# Patient Record
Sex: Female | Born: 1970 | Race: White | Hispanic: No | Marital: Married | Smoking: Current every day smoker
Health system: Southern US, Community
[De-identification: ages and names within clinical notes are randomized; demographics above are authoritative.]

## PROBLEM LIST (undated history)

## (undated) DIAGNOSIS — R519 Headache, unspecified: Secondary | ICD-10-CM

## (undated) DIAGNOSIS — R51 Headache: Secondary | ICD-10-CM

## (undated) DIAGNOSIS — C801 Malignant (primary) neoplasm, unspecified: Secondary | ICD-10-CM

## (undated) DIAGNOSIS — T7840XA Allergy, unspecified, initial encounter: Secondary | ICD-10-CM

## (undated) HISTORY — DX: Headache, unspecified: R51.9

## (undated) HISTORY — DX: Headache: R51

## (undated) HISTORY — DX: Malignant (primary) neoplasm, unspecified: C80.1

## (undated) HISTORY — DX: Allergy, unspecified, initial encounter: T78.40XA

## (undated) HISTORY — PX: TUBAL LIGATION: SHX77

---
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
## 1995-02-15 HISTORY — PX: TONSILECTOMY/ADENOIDECTOMY WITH MYRINGOTOMY: SHX6125

## 2000-03-23 ENCOUNTER — Encounter: Payer: Self-pay | Admitting: Family Medicine

## 2000-03-23 ENCOUNTER — Encounter: Admission: RE | Admit: 2000-03-23 | Discharge: 2000-03-23 | Payer: Self-pay | Admitting: Family Medicine

## 2000-03-27 ENCOUNTER — Encounter: Payer: Self-pay | Admitting: Family Medicine

## 2000-03-27 ENCOUNTER — Encounter: Admission: RE | Admit: 2000-03-27 | Discharge: 2000-03-27 | Payer: Self-pay | Admitting: Family Medicine

## 2001-07-23 ENCOUNTER — Encounter: Payer: Self-pay | Admitting: Family Medicine

## 2001-07-23 ENCOUNTER — Encounter: Admission: RE | Admit: 2001-07-23 | Discharge: 2001-07-23 | Payer: Self-pay | Admitting: Family Medicine

## 2001-08-28 ENCOUNTER — Inpatient Hospital Stay (HOSPITAL_COMMUNITY): Admission: EM | Admit: 2001-08-28 | Discharge: 2001-09-04 | Payer: Self-pay | Admitting: Psychiatry

## 2001-08-28 ENCOUNTER — Encounter: Payer: Self-pay | Admitting: Emergency Medicine

## 2001-08-28 ENCOUNTER — Emergency Department (HOSPITAL_COMMUNITY): Admission: EM | Admit: 2001-08-28 | Discharge: 2001-08-28 | Payer: Self-pay | Admitting: Emergency Medicine

## 2001-09-28 ENCOUNTER — Inpatient Hospital Stay (HOSPITAL_COMMUNITY): Admission: EM | Admit: 2001-09-28 | Discharge: 2001-09-30 | Payer: Self-pay | Admitting: Psychiatry

## 2002-10-24 ENCOUNTER — Emergency Department (HOSPITAL_COMMUNITY): Admission: EM | Admit: 2002-10-24 | Discharge: 2002-10-24 | Payer: Self-pay | Admitting: Emergency Medicine

## 2002-10-24 ENCOUNTER — Encounter: Payer: Self-pay | Admitting: Emergency Medicine

## 2003-01-05 ENCOUNTER — Emergency Department (HOSPITAL_COMMUNITY): Admission: EM | Admit: 2003-01-05 | Discharge: 2003-01-05 | Payer: Self-pay | Admitting: Emergency Medicine

## 2003-04-10 ENCOUNTER — Emergency Department (HOSPITAL_COMMUNITY): Admission: EM | Admit: 2003-04-10 | Discharge: 2003-04-11 | Payer: Self-pay | Admitting: Emergency Medicine

## 2004-02-11 ENCOUNTER — Ambulatory Visit (HOSPITAL_COMMUNITY): Admission: RE | Admit: 2004-02-11 | Discharge: 2004-02-11 | Payer: Self-pay | Admitting: Family Medicine

## 2004-02-28 ENCOUNTER — Emergency Department (HOSPITAL_COMMUNITY): Admission: EM | Admit: 2004-02-28 | Discharge: 2004-02-28 | Payer: Self-pay | Admitting: Emergency Medicine

## 2004-03-08 ENCOUNTER — Ambulatory Visit (HOSPITAL_COMMUNITY): Admission: RE | Admit: 2004-03-08 | Discharge: 2004-03-08 | Payer: Self-pay | Admitting: Family Medicine

## 2004-03-11 ENCOUNTER — Ambulatory Visit (HOSPITAL_COMMUNITY): Admission: RE | Admit: 2004-03-11 | Discharge: 2004-03-11 | Payer: Self-pay | Admitting: Family Medicine

## 2004-05-09 ENCOUNTER — Emergency Department (HOSPITAL_COMMUNITY): Admission: EM | Admit: 2004-05-09 | Discharge: 2004-05-09 | Payer: Self-pay | Admitting: Emergency Medicine

## 2004-09-21 ENCOUNTER — Encounter (INDEPENDENT_AMBULATORY_CARE_PROVIDER_SITE_OTHER): Payer: Self-pay | Admitting: *Deleted

## 2004-09-21 ENCOUNTER — Ambulatory Visit (HOSPITAL_COMMUNITY): Admission: RE | Admit: 2004-09-21 | Discharge: 2004-09-21 | Payer: Self-pay | Admitting: Gastroenterology

## 2005-05-27 IMAGING — CT CT ABDOMEN W/ CM
1 of 3 series · 13 of 32 positions shown, 19 images · non-contrast
Comparison: none

HISTORY: Abdominal and pelvic pain

[Series 7487: — · axial · 0.62mm/px · z∈[+1190,+1550]mm · 13 of 86 slices shown, 19 images]
[im 7/86  soft-tissue]
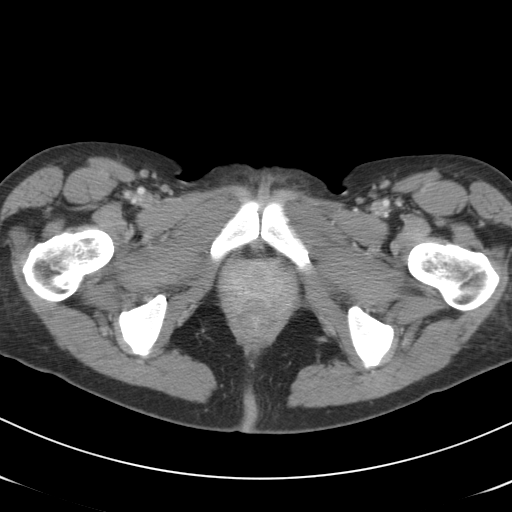
[im 7/86  bone]
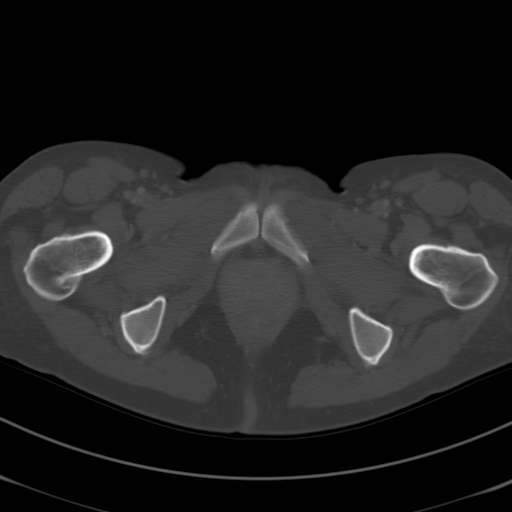
[im 13/86  soft-tissue]
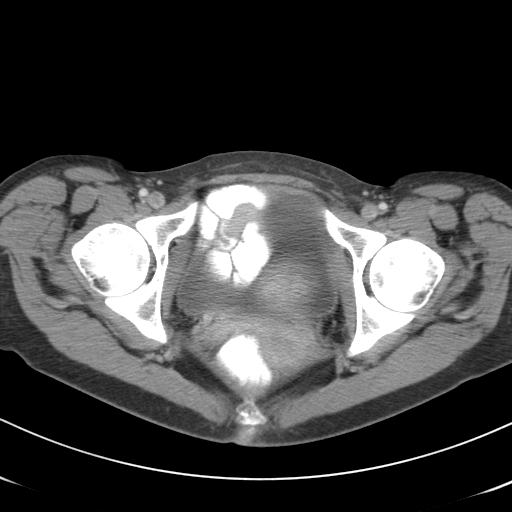
[im 19/86  soft-tissue]
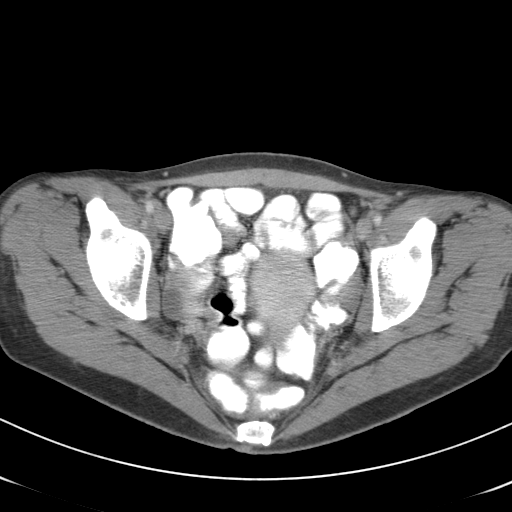
[im 25/86  soft-tissue]
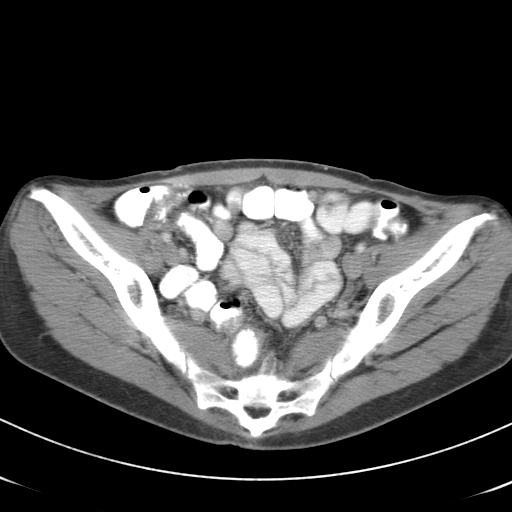
[im 31/86  soft-tissue]
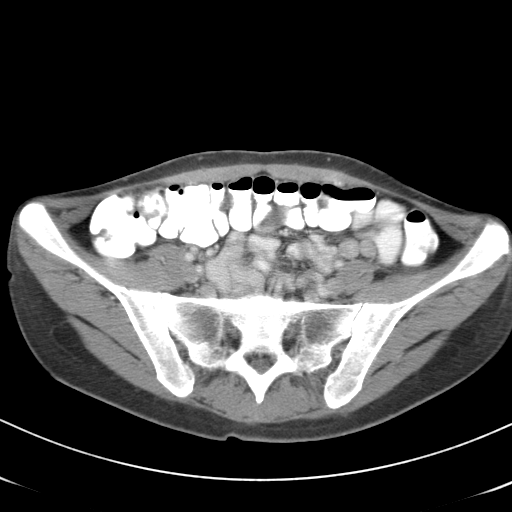
[im 37/86  soft-tissue]
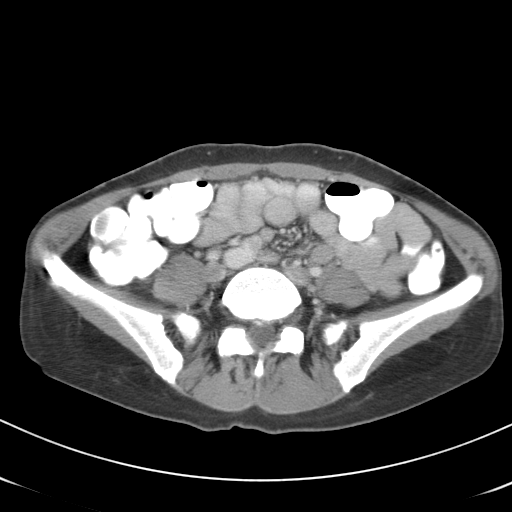
[im 43/86  soft-tissue]
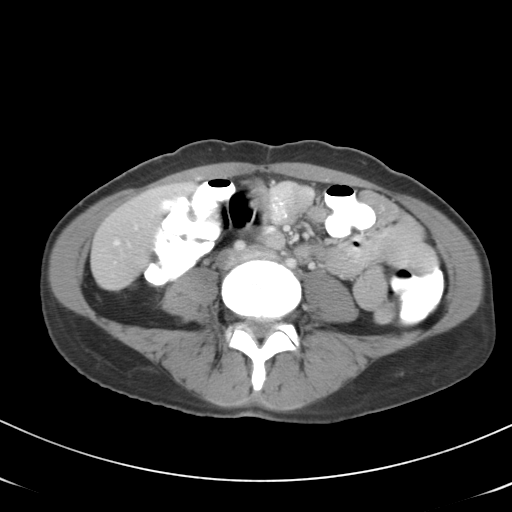
[im 49/86  soft-tissue]
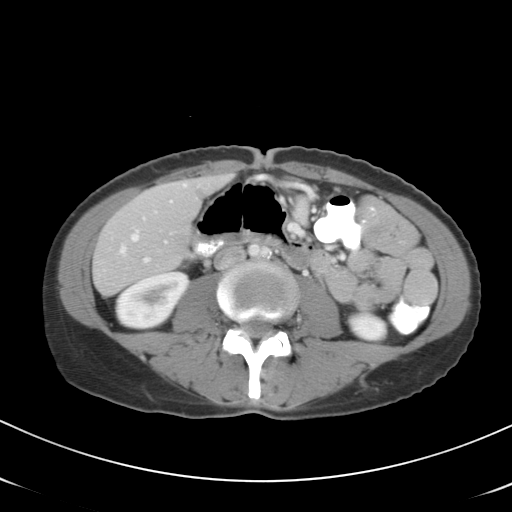
[im 55/86  soft-tissue]
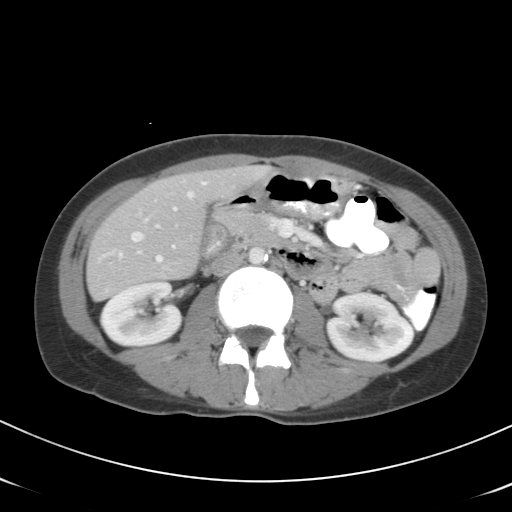
[im 55/86  bone]
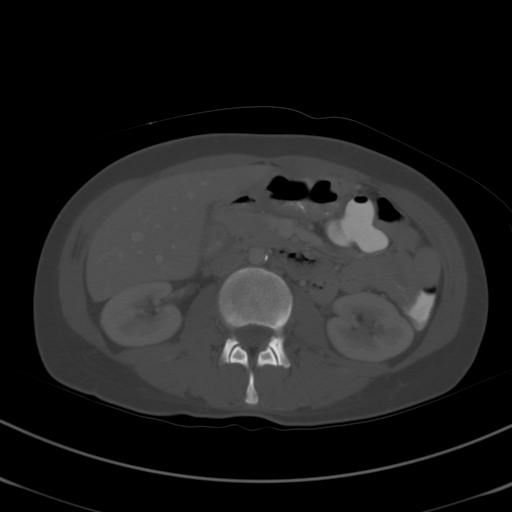
[im 61/86  soft-tissue]
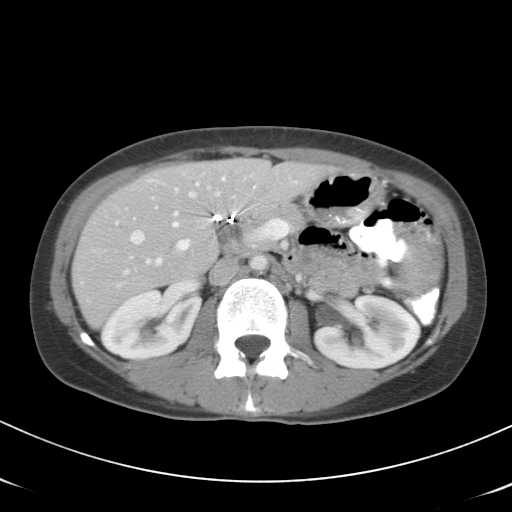
[im 61/86  lung]
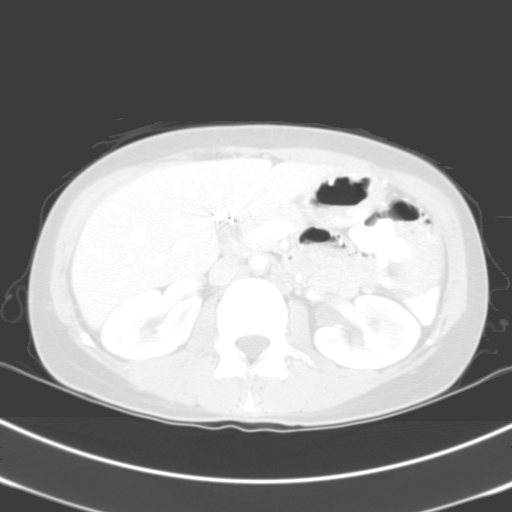
[im 67/86  soft-tissue]
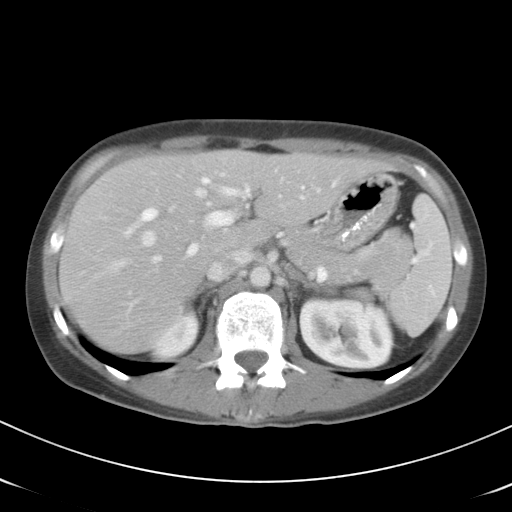
[im 67/86  lung]
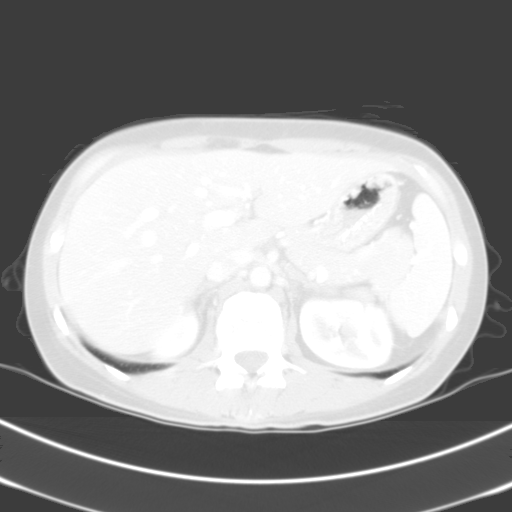
[im 73/86  soft-tissue]
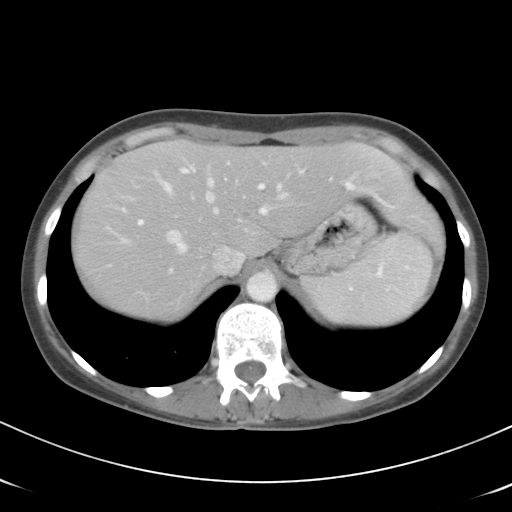
[im 73/86  lung]
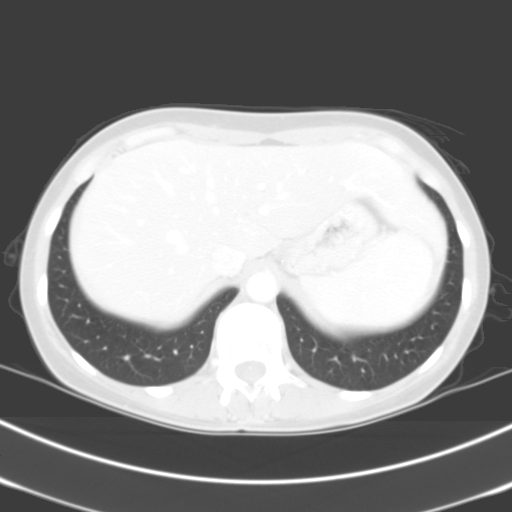
[im 79/86  soft-tissue]
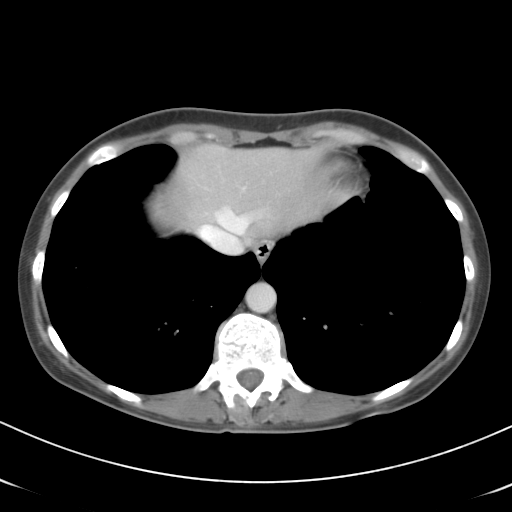
[im 79/86  lung]
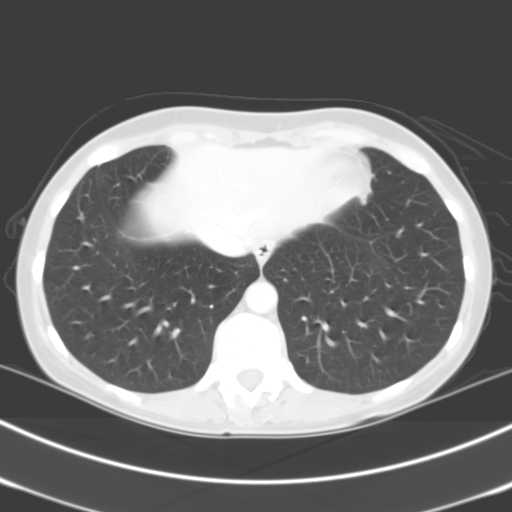

[13 of 32 positions shown; findings below may reference images not displayed]

CT ABDOMEN AND PELVIS WITH CONTRAST:

Multidetector helical CT imaging abdomen and pelvis performed.
Exam utilized dilute oral contrast and 100 cc [id number].
No prior study for comparison

CT ABDOMEN:

Lung bases clear.
Status post cholecystectomy.
Liver, spleen, pancreas, kidneys, and adrenal glands normal.
No upper abdominal mass, adenopathy, free fluid, or inflammatory process.
No gross abnormality of bowel loops seen.
IMPRESSION: No acute abnormalities.

CT PELVIS:

Small right ovarian cyst, 1.6 x 1.1 cm, image 69.
Tiny left ovarian cyst, 1.1 x 1 point cm, image 65.
Remainder of pelvis unremarkable.
Bowel loops normal appearance.
Appendix not seen.
No additional pelvic mass, adenopathy, or free fluid.
IMPRESSION: Small bilateral ovarian cysts. Otherwise negative exam.

## 2005-05-30 IMAGING — NM NM LIVER FUNCTION STUDY
1 series · 6 of 6 positions shown · non-contrast
Comparison: none

CLINICAL DATA: Right upper quadrant pain.
 NUCLEAR MEDICINE HEPATOBILIARY SCAN
 Hepatobiliary study was performed after the IV administration of 5.0 mCi Gc-997 Choletec.  Images were performed at different intervals up to one hour.  

 The liver was seen promptly followed by the biliary ducts.  The gallbladder was not visualized, which is consistent with patient?s history of having cholecystectomy.  At the end of one hour, most of the activity cleared from the liver and was concentrated in small bowel.

[Series 1: hepatobiliary · 3.20mm/px · 6 of 60 frames shown]
[frame 6/60]
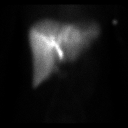
[frame 16/60]
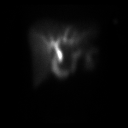
[frame 26/60]
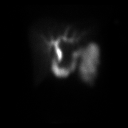
[frame 36/60]
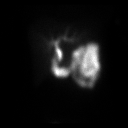
[frame 46/60]
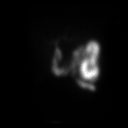
[frame 56/60]
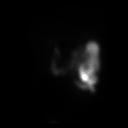

[6 of 6 positions shown; findings below may reference images not displayed]

IMPRESSION: 1.  Gallbladder was not visualized, which is consistent with patient?s history of   having cholecystectomy.  
 2.  No evidence of common bile duct obstruction.

## 2006-08-24 ENCOUNTER — Emergency Department (HOSPITAL_COMMUNITY): Admission: EM | Admit: 2006-08-24 | Discharge: 2006-08-24 | Payer: Self-pay | Admitting: Emergency Medicine

## 2006-08-25 ENCOUNTER — Inpatient Hospital Stay (HOSPITAL_COMMUNITY): Admission: EM | Admit: 2006-08-25 | Discharge: 2006-08-28 | Payer: Self-pay | Admitting: Emergency Medicine

## 2006-09-07 ENCOUNTER — Inpatient Hospital Stay (HOSPITAL_COMMUNITY): Admission: EM | Admit: 2006-09-07 | Discharge: 2006-09-09 | Payer: Self-pay | Admitting: Emergency Medicine

## 2008-10-01 ENCOUNTER — Encounter: Admission: RE | Admit: 2008-10-01 | Discharge: 2008-10-01 | Payer: Self-pay | Admitting: Family Medicine

## 2009-09-16 ENCOUNTER — Ambulatory Visit: Payer: Self-pay | Admitting: Emergency Medicine

## 2009-09-16 DIAGNOSIS — J0100 Acute maxillary sinusitis, unspecified: Secondary | ICD-10-CM | POA: Insufficient documentation

## 2009-09-16 DIAGNOSIS — E785 Hyperlipidemia, unspecified: Secondary | ICD-10-CM | POA: Insufficient documentation

## 2009-09-16 LAB — CONVERTED CEMR LAB: Rapid Strep: NEGATIVE

## 2010-03-06 ENCOUNTER — Encounter: Payer: Self-pay | Admitting: Family Medicine

## 2010-03-07 ENCOUNTER — Encounter: Payer: Self-pay | Admitting: Internal Medicine

## 2010-03-08 ENCOUNTER — Encounter: Payer: Self-pay | Admitting: Family Medicine

## 2010-03-18 NOTE — Assessment & Plan Note (Signed)
Summary: SORE THROAT/COLD   Vital Signs:  Patient Profile:   40 Years Old Female CC:      sore throat, sinus congestion, rash, cold symptoms  X 3 weeks Height:     67 inches Weight:      106 pounds O2 Sat:      100 % O2 treatment:    Room Air Temp:     97.9 degrees F oral Pulse rate:   100 / minute Resp:     14 per minute BP sitting:   120 / 76  (right arm) Cuff size:   regular  Vitals Entered By: Lajean Saver RN (September 16, 2009 3:52 PM)                  Updated Prior Medication List: SUDAFED PE DAY & NIGHT COLD  MISC (PE-DIPHENHYDRAMINE-DM-GG-APAP) prn IBUPROFEN 400 MG TABS (IBUPROFEN) prn  Current Allergies: ! CODEINEHistory of Present Illness Chief Complaint: sore throat, sinus congestion  cold symptoms  X 3 weeks History of Present Illness: ST, sinus congestion, URI cold symptoms for 3 weeks.  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  She feels like it is getting worse the last few days with green nasal discharge, 99.5 temp, fatigue, and the feeling that she has the flu.  Also with facial pain.  Mucinex and Sudafed help.  REVIEW OF SYSTEMS Constitutional Symptoms       Complains of fatigue.     Denies fever, chills, night sweats, weight loss, and weight gain.      Comments: body aches Eyes       Complains of eye pain.      Denies change in vision, eye discharge, glasses, contact lenses, and eye surgery.      Comments: itchy Ear/Nose/Throat/Mouth       Complains of ear pain, sinus problems, and sore throat.      Denies hearing loss/aids, change in hearing, ear discharge, dizziness, frequent runny nose, frequent nose bleeds, hoarseness, and tooth pain or bleeding.  Respiratory       Complains of productive cough.      Denies dry cough, wheezing, shortness of breath, asthma, bronchitis, and emphysema/COPD.  Cardiovascular       Denies murmurs, chest pain, and tires easily with exhertion.    Gastrointestinal  Denies stomach pain, nausea/vomiting, diarrhea, constipation, blood in bowel movements, and indigestion. Genitourniary       Denies painful urination, kidney stones, and loss of urinary control. Neurological       Complains of headaches.      Denies paralysis, seizures, and fainting/blackouts. Musculoskeletal       Denies muscle pain, joint pain, joint stiffness, decreased range of motion, redness, swelling, muscle weakness, and gout.  Skin       Denies bruising, unusual mles/lumps or sores, and hair/skin or nail changes.      Comments: abdomen Psych       Denies mood changes, temper/anger issues, anxiety/stress, speech problems, depression, and sleep problems. Other Comments: rash started first 3 weeks ago, cold and URI symptoms began shortly after   Past History:  Past Medical History: Hyperlipidemia Gestational diabetes HPV  Past Surgical History: Tubal ligation Cholecystectomy Tonsillectomy  Family History: DM- mother Family History Hypertension- mother Thyroid CA- mother  Social History: Occupation: med [employer] Current Smoker Alcohol use-no Drug use-no Married Smoking Status:  current Drug Use:  no Physical Exam General appearance: well developed, well  nourished, no acute distress Head: maxillary sinus tenderness Ears: normal, no lesions or deformities Nasal: mucosa pink, nonedematous, no septal deviation, turbinates normal Oral/Pharynx: pharyngeal erythema without exudate, uvula midline without deviation Neck: ant cerv LAD tender Chest/Lungs: no rales, wheezes, or rhonchi bilateral, breath sounds equal without effort Heart: regular rate and  rhythm, no murmur Assessment New Problems: ACUTE MAXILLARY SINUSITIS (ICD-461.0) HYPERLIPIDEMIA (ICD-272.4)  Rapid strep negative  Patient Education: Patient and/or caregiver instructed in the following: rest, fluids, Ibuprofen prn. Mucinex and Sudafed PRN  Plan New Medications/Changes: MEDROL (PAK) 4 MG TABS  (METHYLPREDNISOLONE) use as directed  #1 x 0, 09/16/2009, Hoyt Koch MD AMOXICILLIN 875 MG TABS (AMOXICILLIN) 1 tab by mouth two times a day for 10 days  #20 x 0, 09/16/2009, Hoyt Koch MD  New Orders: New Patient Level III [[address]] Rapid Strep [[address]] Follow Up: Follow up with Primary Physician  The patient and/or caregiver has been counseled thoroughly with regard to medications prescribed including dosage, schedule, interactions, rationale for use, and possible side effects and they verbalize understanding.  Diagnoses and expected course of recovery discussed and will return if not improved as expected or if the condition worsens. Patient and/or caregiver verbalized understanding.  Prescriptions: MEDROL (PAK) 4 MG TABS (METHYLPREDNISOLONE) use as directed  #1 x 0   Entered and Authorized by:   Hoyt Koch MD   Signed by:   Hoyt Koch MD on 09/16/2009   Method used:   Printed then faxed to ...       [address] [phone]* (retail)       [address] [place], [place]  [address]       Ph: [phone]       Fax: [phone]   RxID:   [id number] AMOXICILLIN 875 MG TABS (AMOXICILLIN) 1 tab by mouth two times a day for 10 days  #20 x 0   Entered and Authorized by:   Hoyt Koch MD   Signed by:   Hoyt Koch MD on 09/16/2009   Method used:   Printed then faxed to ...       [facility] [address] [phone]* (retail)       [address] [place], [place]  [address]       Ph: [phone]       Fax: [phone]   RxID:   [id number]   Orders Added: 1)  New Patient Level III [[address]] 2)  Rapid Strep [35573]  Laboratory Results  Date/Time Received: September 16, 2009 4:39 PM  Date/Time Reported: September 16, 2009 4:39 PM   Other Tests  Rapid Strep: negative  Kit Test Internal QC: Negative   (Normal Range: Negative)

## 2010-03-18 NOTE — Letter (Signed)
Summary: Out of Work  [facility]  [address]   [place], [place] [address]   Phone: [phone]  Fax: [phone]    September 16, 2009   Employee:  [employer] [employer]    To Whom It May Concern:   For Medical reasons, please excuse the above named employee from work for the following dates:  Start:   09/16/09  End:   09/17/09  If you need additional information, please feel free to contact our office.         Sincerely,    Hoyt Koch MD

## 2010-06-29 NOTE — H&P (Signed)
NAME:  Sheila Hobbs, Sheila Hobbs                ACCOUNT NO.:  [id number]   MEDICAL RECORD NO.:  [id number]          PATIENT TYPE:  INP   LOCATION:  A212                          FACILITY:  [facility]   PHYSICIAN:  Dorris Singh, DO    DATE OF BIRTH:  1970-06-30   DATE OF ADMISSION:  08/24/2006  DATE OF DISCHARGE:  LH                              HISTORY & PHYSICAL   CHIEF COMPLAINT:  Nausea and vomiting.   HISTORY OF PRESENT ILLNESS:  The patient is a 40 year old Caucasian  female who presented to the emergency room at [facility] with a  complaint of nausea and vomiting.  She apparently was discharged from  [facility] 4 days ago from rehab for benzodiazepine dependence, and has noticed  after she was discharged she started having increased pain, nausea and  vomiting, as well as tremors.  She called her counselor, whose names is  Charlyne Petrin, a substance abuse counselor, and she wanted to be  accepted to [facility], but due to the nausea and vomiting they wanted to make  sure that this was not an infectious cause prior to admission into the  program while she is here.   PAST MEDICAL HISTORY:  Bipolar disorder.   SOCIAL HISTORY:  She is a current smoker, 1 pack per day.  No illicit  drug abuse.  She is an occasional drinker.   FAMILY HISTORY:  Noncontributory.   She is currently on Xanax, which she took within the last 2 days; and  Paxil, dose is unknown.  She does cut 1 pill in half.   SURGICAL HISTORY:  1. Cholecystectomy.  2. Tubal ligation.   REVIEW OF SYSTEMS:  CONSTITUTIONAL:  Positive for weight loss, and  positive for weakness and decreased appetite.  Negative for eye pain,  discharge, or double vision.  ENMT:  Negative for ear pain, hearing  loss, or sinus pressure.  CARDIOVASCULAR:  Positive for palpitations.  Negative for chest pain or bradycardia.  RESPIRATORY:  Negative for  cough or dyspnea or wheezing.  GASTROINTESTINAL:  Positive for nausea  and vomiting and abdominal pain.  GU:   Negative for dysuria, urgency, or  dribbling.  MUSCULOSKELETAL:  Positive for arthralgias.  SKIN:  Negative  for rashes.  NEUROLOGIC:  Positive for headache.  PSYCHIATRIC:  Positive  for agitation and bipolar disorder.   PHYSICAL EXAMINATION:  VITAL SIGNS:  Here in the emergency room, blood  pressure 112/69, pulse rate 74, respirations 16, and saturating 100% on  room air.  GENERAL:  This is a 40 year old Caucasian female who is sitting in the  emergency room, under no acute distress.  She is cooperative and answers  questions appropriately.  SKIN:  She does not have any scars or rashes, but does have an abdominal  ring.  HEENT:  Head is normocephalic, atraumatic.  Pupils are equal and  reactive to light.  EOMI bilaterally.  Ears:  Shape is symmetrica.  TMIs  visualized bilaterally.  Nose symmetrical.  No tenderness or discharge  noted.  Mouth and throat:  Hygiene is fair.  No erythema or exudate.  NECK:  No masses.  Full range of motion.  No tracheal deviation.  Thyroid is within normal limits.  HEART:  Regular rate and rhythm.  No rubs or gallops or clicks.  No  murmurs noted.  LUNGS:  Clear to auscultation bilaterally.  No wheezes, rales, or  rhonchi.  ABDOMEN:  Soft, nontender, nondistended.  Positive abdominal ring.  No  rebound or guarding noted.  MUSCULOSKELETAL:  No muscular atrophy or weakness.  Full range of motion  of all joints.  No swelling or tenderness.  NEUROLOGIC:  Cranial nerves II-XII grossly intact.   LABORATORIES:  Done while the patient was in the hospital, include a  urine with which everything was within normal limits.  BMP:  The only  pertinent positive was that her glucose was high at 114, and her BUN was  low at 3.  Everything else was within normal limits.  Her alcohol level  was less than 5.  She had a urine drug screen which was positive for  benzodiazepines, but negative for everything else.  Her CBC was with a  white count of 16.2, hemoglobin 12.8,  hematocrit 38.2, platelets 477.  She also had a chest x-ray done which showed no active cardiopulmonary  disease.   ASSESSMENT AND PLAN:  1. Benzodiazepine withdrawal.  2. Nausea and vomiting.  3. Leukocytosis.   PLAN:  Will admit the patient for observation to 3A.  I contacted the  patient's counselor, with the patient's permission, and Porfirio Mylar at [facility],  with the patient's permission, and they said that as long as she is  stable and has not vomited for 24 hours, then she has admission into the  [facility] program.  Will put her on a regular diet as tolerated.  Will keep  her saline locked and start antibiotics due to leukocytosis at 500 mg  p.o. daily.  Will also obtain blood cultures and a urine C&S.  Will  start the patient on a modified Ativan withdrawal program, with starting  her on day 3 with Ativan 1 mg q.12 h., and decrease as appropriate.  Also put patient on Nicoderm patch  and do GI and DVT prophylaxis, and also have the patient discussed about  smoking cessation, and have behavioral health come see patient.      Dorris Singh, DO  Electronically Signed     CB/MEDQ  D:  08/24/2006  T:  08/25/2006  Job:  [id number]

## 2010-06-29 NOTE — Group Therapy Note (Signed)
NAME:  Sheila Hobbs, Sheila Hobbs                ACCOUNT NO.:  [id number]   MEDICAL RECORD NO.:  [id number]          PATIENT TYPE:  INP   LOCATION:  A212                          FACILITY:  [facility]   PHYSICIAN:  Dorris Singh, DO    DATE OF BIRTH:  1970/07/16   DATE OF PROCEDURE:  08/25/2006  DATE OF DISCHARGE:                                 PROGRESS NOTE   HISTORY OF PRESENT ILLNESS:  The patient was seen resting in bed. Still  having some abdominal pain but does feel a lot better. Spoke with Maisie Fus  on the [employer] and stated that she does have a place to go for  rehabilitation but due to her still having nausea and vomiting, that we  should wait and have her have this issue resolved. The patient did have  a bout with nausea but is resolved.   PHYSICAL EXAMINATION:  VITAL SIGNS:  Temperature 97, pulse 62,  respiratory rate 20, blood pressure 114/62.  GENERAL:  This is a 40 year old female who is well developed, well  nourished, and in no acute distress.  HEART:  Regular rate and rhythm. No murmurs. S2 and S2 present.  LUNGS:  Clear to auscultation bilaterally.  ABDOMEN:  Generalized tenderness. No rebound or rigidity noted.  EXTREMITIES:  Positive pulses. No ecchymosis, cyanosis, or edema noted.   LABORATORY DATA:  For today are as follows:  She had a CBC done with  white blood cell count of 6.8, which is decreased from 16.2. Hemoglobin  and hematocrit 11.4 and 34.3. Platelet count 384,000. CMP was done with  a sodium of 138, potassium 4.0, chloride 109, CO2 26, glucose 89, BUN 2,  creatinine 0.79.   ASSESSMENT/PLAN:  1. Benzodiazepine withdrawal. Will continue to decrease her Ativan.      She should be on the 1 Ativan per 24 hour schedule at this point in      time.  2. Nausea and vomiting. Will continue with antiemetics.  3. Leukocytosis is resolving. Will continue her antibiotics for 1 or      more days.  4. Await further recommendations with a possible discharge on Saturday      to a  facility to help patient with her current addiction.      Dorris Singh, DO  Electronically Signed     CB/MEDQ  D:  08/25/2006  T:  08/26/2006  Job:  [phone]

## 2010-06-29 NOTE — H&P (Signed)
NAME:  Sheila Hobbs, Sheila Hobbs                ACCOUNT NO.:  [id number]   MEDICAL RECORD NO.:  [id number]          PATIENT TYPE:  INP   LOCATION:  A216                          FACILITY:  [facility]   PHYSICIAN:  Marcello Moores, MD   DATE OF BIRTH:  Sep 23, 1970   DATE OF ADMISSION:  09/07/2006  DATE OF DISCHARGE:  LH                              HISTORY & PHYSICAL   CHIEF COMPLAINT:  Tooth pain, tremor and jerking.   HISTORY OF PRESENT ILLNESS:  Ms. Sheila Hobbs is a 40 year old female patient  who was taking benzodiazepine and other psychiatric medications for  several years and recently she was trying to be off of these medications  but she developed vomiting and nausea with tremor and jerking, and she  was admitted to on July 10 to [facility] and she was discharged  fairly improved to have follow-up with mental health and with possible  he rehab, but she stated that even though she tried to have contact with  mental status in the [facility] medical department, she was not able to  go to any rehab facility.  The patient had rehab before and after she  stopped all her medications, she was admitted with the above complaints  with vomiting and tremor and she was discharged fairly improved last  time on July 14, and now she stated that also she came with the same  complaints but when I interviewed her, she was more concerned about her  tooth pain rather than the vomiting or tremor.  Otherwise she has no  fever, no headache, and she has not any diarrhea or constipation and she  has not any urinary complaints.  She has not any chest complaints.   SOCIAL HISTORY:  She is married and living with her husband, and she is  smoker.  She is an occasional drinker and she was on multiple  benzodiazepines and psychiatric medications before.   FAMILY HISTORY:  Noncontributory.   PAST MEDICAL HISTORY:  Bipolar disorder.   SURGICAL HISTORY:  She has a cholecystectomy and tubal ligation.   PHYSICAL  EXAMINATION:  VITAL SIGNS:  Temperature 98, pulse 67,  respiratory rate 22, blood pressure 128/78, and she is saturating 100%.  HEENT:  She has pink conjunctivae.  Nonicteric sclerae.  She has decayed  right lower premolars.  NECK:  Supple.  CHEST:  Good air entry bilateral.  CVS:  S1-S2 well-heard regular.  No murmur.  ABDOMEN:  Soft.  No area of tenderness.  EXTREMITIES:  No pedal edema.  CNS:  She is alert and well-oriented and she has fine tremor on her both  hands on extension of the upper extremities.   LABS:  White blood cells 10.6 and hemoglobin is 42 and platelet is 549.  On the chemistry, sodium is 138, potassium is 3.9, chloride is 107,  bicarb 24, glucose is 99, BUN 4, creatinine 0.6.  Urinalysis is  negative.   ASSESSMENT:  1. Tooth pain secondary to decay.  The plan is we will put her on pain      medication, and otherwise currently there is no sign  of any      infection or abscess.  She will be tries to visit a dentist on      discharge.  2. Benzodiazepine withdrawal with tremor, vomiting and nausea.  The      patient stated that she does not want any Xanax and related      medications.  She is stating she will be able to tolerate it and      the ACT team will be consulted to help her to get any rehab      assistance from the [facility].  Also, she      stated they came to see her and she will contact them tomorrow and      will see what they can help her, and she will be able probably to      be discharged tomorrow.   Otherwise, the patient will be put on pain medications and she will be  observed overnight for any sign of new tumor, and she will be able to be  discharged if she remains stable.      Marcello Moores, MD  Electronically Signed     MT/MEDQ  D:  09/07/2006  T:  09/08/2006  Job:  [id number]

## 2010-06-29 NOTE — Discharge Summary (Signed)
NAME:  Sheila Hobbs, Sheila Hobbs                ACCOUNT NO.:  [id number]   MEDICAL RECORD NO.:  [id number]          PATIENT TYPE:  INP   LOCATION:  A216                          FACILITY:  [facility]   PHYSICIAN:  Osvaldo Shipper, MD     DATE OF BIRTH:  03-13-1970   DATE OF ADMISSION:  09/07/2006  DATE OF DISCHARGE:  07/26/2008LH                               DISCHARGE SUMMARY   Patient does not have a primary medical doctor at this time.   DISCHARGE DIAGNOSES:  1. Benzodiazepine withdrawal, improving.  2. Tooth pain requiring followup with the dentist.  3. Hypokalemia, resolved.  4. Mild dehydration, resolved.   Please see history and physical dictated by Dr. Benson Setting for details  regarding patient's history of present illness.   BRIEF HOSPITAL COURSE:  Briefly, this is a 40 year old Caucasian female  who has really no other medical problems, who was on Xanax for anxiety,  was taking significant amounts of it, and she suddenly quit taking it a  few weeks ago.  Apparently, she lost a daughter about 2 months ago.  Patient was actually admitted to this hospital from July 10 to July 14  for similar complaints.  She presented again with nausea, vomiting,  diarrhea.  The patient was given IV hydration.  She was given  symptomatic treatment for her nausea and vomiting.  She has improved.  She still has occasional episodes of emesis.  Her diarrhea has  significantly improved.  She has had just two episodes in the last two  days.  We did obtain an acute abdominal series yesterday, which did not  show any evidence for any obstruction.  The only thing which is pending  at this time is to check her pancreatic enzymes to make sure she does  not have pancreatitis.  If that is negative, I think the patient can go  home.   Her LFTs, however, are quite normal.   Patient also was complaining of tooth pain when she came in.  Apparently, one of her teeth broke.  Examination of the oral cavity  revealed a slight  amount of decay in the right premolars.  I have  started the patient on clindamycin, which she will need to take for the  next 5 days.  I have asked her to see a dentist as soon as possible for  her tooth pain.  Vicodin will also be prescribed for the pain.   Today, the patient is feeling better.  She has been tolerating p.o.  intake.  She is complaining of some urinary frequency but no burning  sensation.  Examination reveals that she has not had any fevers.  Her  vital signs otherwise are stable.  Examination otherwise also reveals  mild diffuse tenderness in the abdomen.  No rebound or guarding.  Bowel  sounds are present.  No mass or organomegaly is appreciated.   LABORATORY DATA:  Reveals that her potassium today is 4.1.  Renal  function is normal.   PLAN:  As above and also will check urinalysis to make sure there is no  urinary tract infection before we  discharge her.   DISCHARGE MEDICATIONS:  1. Clindamycin 300 mg three times a day for 5 days.  2. Phenergan 25 mg Orally every 4 hours as needed for nausea and      vomiting.  We have prescribed 20 tablets.  3. Vicodin 5/500 one tablet every 4 hours as needed for pain.  We have      prescribed 20 tablets of this.  4. Ativan 0.5 mg every 8 hours as needed for anxiety, only 10 tablet      have been prescribed.  5. She has been asked to take over-the-counter Imodium for diarrhea as      needed.  6. She may continue to take Paxil as before.  7. Cipro 250mg  twice daily for 3 days   She has also been evaluated by our behavior health team, and they see no  need for inpatient rehabilitation.  There is no suicidal or homicidal  ideation at this time.   Follow up with the dentist as soon as possible; with a primary medical  doctor as needed.  She has been set up to follow up with mental health  by the behavioral health team.   DIET:  Regular diet.   PHYSICAL ACTIVITY:  As before.   Essentially, we are awaiting amylase, lipase  levels and a urinalysis,  and depending on the above, she will be discharged later today.      Osvaldo Shipper, MD  Electronically Signed     GK/MEDQ  D:  09/09/2006  T:  09/09/2006  Job:  [id number]

## 2010-06-29 NOTE — Discharge Summary (Signed)
NAME:  Sheila Hobbs, Sheila Hobbs                ACCOUNT NO.:  [id number]   MEDICAL RECORD NO.:  [id number]          PATIENT TYPE:  INP   LOCATION:  A212                          FACILITY:  [facility]   PHYSICIAN:  Marcello Moores, MD   DATE OF BIRTH:  [birth year]   DATE OF ADMISSION:  08/24/2006  DATE OF DISCHARGE:  07/14/2008LH                               DISCHARGE SUMMARY   DISCHARGE DIAGNOSES:  1. Nausea/vomiting, resolved.  2. Benzodiazepine withdrawal with tremor fairly controlled.  3. Leukocytosis, resolved.   HOME MEDICATIONS:  1. Phenergan 12.5 mg every 6 hours.  2. Ativan 1 mg p.o. every 12 hours.   HOSPITAL COURSE:  The patient is a 40 year old female patient who was  taking benzodiazepines and other psychiatric medications for several  years, and recently she was trying to be off the benzodiazepines, and  she was in rehab for benzodiazepine dependence for a few days, and she  was discharged and after she gets discharged she started to have  vomiting, pain and nausea with a tremor, and she presented to the  emergency room.  Otherwise, she did not have other medical complaints.  After her admission, she was put on Ativan and Phenergan, and her  vomiting as well as her tremor fairly controlled, and the ACT team was  also consulted for possible rehab admission, and she was evaluated by  them.  The plan was to go directly from this for rehab program, but  today they stated that that is not possible and she will go to  outpatient to [facility], and she will have follow-  up with them.  For now, I am discharging her with Phenergan and Ativan  for a few days, and to be reevaluated by the mental health department.  Otherwise, the patient now is stable with vital signs: Temperature 98,  pulse 75, respiratory rate 18, blood pressure 121/76, and she had no  vomiting for the last 2 days, and the tremor is much less significant  than it was during the admission.  She will be  discharged today, stable,  to have follow-up with mental health outpatient in the [facility]  as per the ACT Team recommendation, and the patient was strongly advised  to have medical PMD also for her regular checkup and follow-up at the  outpatient level.      Marcello Moores, MD  Electronically Signed     MT/MEDQ  D:  08/28/2006  T:  08/29/2006  Job:  [id number]

## 2010-07-02 NOTE — Discharge Summary (Signed)
NAME:  Sheila Hobbs, Sheila Hobbs                          ACCOUNT NO.:  [id number]   MEDICAL RECORD NO.:  [id number]                  PATIENT TYPE:  IPS   LOCATION:  0505                                 FACILITY:  [facility]   PHYSICIAN:  Geoffery Lyons, MD                     DATE OF BIRTH:  12-09-1970   DATE OF ADMISSION:  09/28/2001  DATE OF DISCHARGE:  09/30/2001                                 DISCHARGE SUMMARY   CHIEF COMPLAINT AND PRESENT ILLNESS:  This was the second admission to [facility] for this 40 year old female who presented with  a history of depression, running out of medications.  She went to her  primary physician, admitted to feeling down and depressed, like suicidal.  She was referred for evaluation and admitted to [facility].  She had not  been able to go back to work.  There were some issues at home, some custody  issues that increased her depression but she denied any active suicidal  ideas.   PAST PSYCHIATRIC HISTORY:  She had been discharged from [facility] one month ago.  She was seeing Clarisse Gouge and following up at  [facility].   SUBSTANCE ABUSE HISTORY:  Denied the use or abuse of any substances.   PAST MEDICAL HISTORY:  Irritable bowel syndrome.   MEDICATIONS:  1. Trazodone 100 mg at bedtime.  2. Atarax 25 mg every six hours p.r.n..  3. Xanax 1 mg three times a day.  4. Risperdal 0.25 mg twice a day.  5. Zoloft 100 mg every day.  6. Wellbutrin slow release 150 mg daily.   PHYSICAL EXAMINATION:  Physical examination was performed, failed to show  any acute findings.   MENTAL STATUS EXAM:  Mental status exam revealed an alert, thin, unkempt  female, fair eye contact.  Speech: Clear, goal directed.  Mood: Depressed,  angry.  Affect: Sad, tearful.  Thought processes: Coherent and relevant.  No  suicidal ideas, no homicidal ideas, no evidence of psychosis.  Cognitive:  Cognition was well  preserved.   ADMISSION DIAGNOSES:   AXIS I:  1. Major depression, recurrent.  2. Anxiety disorder, not otherwise specified.   AXIS II:  No diagnosis.   AXIS III:  Irritable bowel syndrome.   AXIS IV:  Moderate.   AXIS V:  Global assessment of functioning upon admission 30, highest global  assessment of functioning in the last year 65.   LABORATORY DATA:  Other laboratory workup:  CBC was within normal limits.  Blood chemistries were within normal limits.  Thyroid profile was within  normal limits.   HOSPITAL COURSE:  She was readmitted.  We worked again with coping skills,  stress management, anger management.  She was basically kept on her  medications.  Risperdal was increased to 0.5 mg three times a day.  On  August  17, she said she felt better, she was in full contact with reality,  mood improved, affect bright, no suicidal ideas, no homicidal ideas, said  that everything was a misunderstanding, that she was safe to go home and  continue to work on an outpatient basis.  As she was stable enough and it  was felt that she had obtained benefit from this short stay, we went ahead  and discharged to outpatient treatment.   DISCHARGE DIAGNOSES:   AXIS I:  1. Major depression, recurrent.  2. Anxiety disorder, not otherwise specified.   AXIS II:  No diagnosis.   AXIS III:  Irritable bowel syndrome.   AXIS IV:  Moderate.   AXIS V:  Global assessment of functioning upon discharge 55-60.   DISCHARGE MEDICATIONS:  1. Trazodone 100 mg at bedtime for sleep.  2. Atarax 25 mg every six hours as needed for itching.  3. Risperdal 0.25 mg three times a day.  4. Xanax 1 mg three times a day as needed.  5. Zoloft 100 mg daily.  6. Wellbutrin slow release 150 mg twice a day.  7. Ambien 10 mg at bedtime for sleep.   FOLLOW UP:  He had a followup appointment with mental health center.                                                Geoffery Lyons, MD    IL/MEDQ  D:  10/29/2001   T:  10/30/2001  Job:  [phone]

## 2010-07-02 NOTE — H&P (Signed)
NAME:  Sheila Hobbs, Sheila Hobbs                          ACCOUNT NO.:  [id number]   MEDICAL RECORD NO.:  [id number]                   PATIENT TYPE:  IPS   LOCATION:  0505                                 FACILITY:  [facility]   PHYSICIAN:  Geoffery Lyons, MD                     DATE OF BIRTH:  1971/01/13   DATE OF ADMISSION:  09/28/2001  DATE OF DISCHARGE:  09/30/2001                         PSYCHIATRIC ADMISSION ASSESSMENT   IDENTIFYING INFORMATION:  The patient is a 40 year old widowed white female  voluntarily admitted on September 28, 2001.   HISTORY OF PRESENT ILLNESS:  The patient presents with a history of  depression.  The patient states she ran out of her medication.  She called  the primary care [facility].  She was advised to go to the emergency  department and talk to the assessment team.  She states she has people at  her home who are former patients from [facility] and the  family is upset that they are there.  They have kicked her door in and  wanting them to be gone from her home.  The patient feels that everyone is  against her.  She feels that she was tricked into being here.  She states  she recently had to sign over custody of her daughter to her sister for one  year, which is temporary custody.  She states that the medication is making  her too sleepy.  She denies any psychotic symptoms but states she is not  intending to hurt herself.   PAST PSYCHIATRIC HISTORY:  She was recently discharged from [facility].  This is her second admission.  She was discharged one month  ago.  She sees Clarisse Gouge, who is a Paramedic at [facility].  She has no history of a suicide attempt and did not attend  the IOP Program.   SOCIAL HISTORY:  She is a 40year-old widowed white female.  She has a child.  She lives alone.  She is on disability.  Her child is presently with her  sister; the child is 30 years of age, in temporary custody.   FAMILY  HISTORY:  None.   SUBSTANCE ABUSE HISTORY:  The patient smokes.  She denies any alcohol or  drug use.   PAST MEDICAL HISTORY:  Primary care Aria[facility]: Saul Fordyce, Nurse  Practitioner.  Medical problems: Irritable bowel syndrome.   MEDICATIONS:  1. Trazodone 100 mg q. h.s.  2. Atarax 25 mg q.6 h.  3. Alprazolam 1 mg t.i.d.  4. Risperdal 0.25 mg b.i.d.  5. Zoloft 100 mg every day.  6. Wellbutrin SR 150 mg q.d.   DRUG ALLERGIES:  CODEINE.   PHYSICAL EXAMINATION:  Performed at [facility].   LABORATORY DATA:  CBC was within normal limits.  CMET: Within normal limits.  Urine pregnancy test was negative.  Acetaminophen level  was less than 10.  Urine drug screen was positive for barbiturates, positive for  benzodiazepines.  Alcohol level was less than 5.  Salicylate level was less  than 4.   MENTAL STATUS EXAM:  She is an alert, thin, pale, unkempt female, fair eye  contact.  Speech is clear.  Mood is depressed and angry.  Affect is flat and  tearful.  Thought processes are coherent.  There is no auditory or visual  hallucinations, no suicidal or homicidal ideation or paranoid ideation  apparent at this time.  Cognitive: Intact.  Memory is fair.  Judgment and  insight are fair.   ADMISSION DIAGNOSES:   AXIS I:  1. Major depression, recurrent, severe.  2. History of bulimia.   AXIS II:  Deferred.   AXIS III:  Irritable bowel syndrome.   AXIS IV:  Problems with primary support group and other psychosocial  problems.   AXIS V:  Current is 30, this past year is 61.   INITIAL PLAN OF CARE:  Plan is a voluntary admission to [facility] for depression.  Contract for safety, check every 15 minutes; the  patient promises safety.  Will resume her medications.  Will obtain further  labs as needed.  The patient is to be medication compliant and to follow up  with her therapist and mental health for continuing followup.   ESTIMATED LENGTH OF STAY:  Three to  five days.     Landry Corporal, N.P.                       Geoffery Lyons, MD    JO/MEDQ  D:  10/29/2001  T:  10/29/2001  Job:  [id number]

## 2010-07-02 NOTE — H&P (Signed)
[facility]  Patient:    Sheila Hobbs, Sheila Hobbs Visit Number: [id number] MRN: [id number]          Service Type: PSY Location: [id number] [id number] Attending Physician:  Jeanice Lim Dictated by:   Young Berry Scott, R.N. N.P. Admit Date:  08/27/2001                     Psychiatric Admission Assessment  DATE OF ADMISSION:  August 27, 2001  DATE OF ASSESSMENT:  August 28, 2001, at 10:30 a.m.  PATIENT IDENTIFICATION:  This is a 40 year old Caucasian female who is voluntary.  She is widowed.  HISTORY OF PRESENT ILLNESS:  This patient was referred for 23 hour observation after a 45 pound weight loss over the past nine months or so.  Her father became concerned about her not eating and consistent weight loss and encouraged her to come for mental health evaluation to the assessment team. The patient endorses poor sleep, only two to three hours nightly for the past several weeks, possibly months.  She admits to passive suicidal ideation stating, "I wouldnt care if I died but I wont try to hurt myself."  She reports decreased appetite, decreased concentration, and mild irritability. She endorses some vague suicidal ideation, reports being conscious of people looking at her all the time but denies any overt hallucinations, denies any homicidal ideation.  The patient cites several stressors in the past.  She broke up with a boyfriend in last autumn 2002 around the time her weight loss started.  She also cites the stress of her husbands death by squamous cell cancer of the mouth approximately five years ago.  She denies any panic attacks or mood swings.  She has had considerable problems with being unable to eat and having consistent abdominal pain and has had, so far, negative workup for this.  PAST PSYCHIATRIC HISTORY:  The patient is followed by Dr. Clarisse Gouge who is her psychotherapist and she has had three visits with her.  This is her first inpatient psychiatric  admission.  She denies ever being treated by a psychiatrist in the past; however, her primary care physician had attempted trials of Paxil and Prozac in the past for depression.  The patient does not really remember how well they worked but does admit she did not take anything for very long to know if it did work.  She states that Effexor gave her the shakes and she believes Zyprexa also gave her the shakes.  She denies any history of childhood physical or sexual abuse.  She denies any previous history of suicide attempts.  She believes that her depression started during her husbands illness.  SUBSTANCE ABUSE HISTORY:  The patient denies any substance abuse; denies any abuse of alcohol.  She is a tobacco smoker, smoking approximately one pack per day for many years.  PAST MEDICAL HISTORY:  The patient is followed by Dr. Randa Evens most recently at [facility] for abdominal pain.  This past Friday she had a colonoscopy which was essentially negative.  She is followed by [facility] and sees Saul Fordyce, the nurse practitioner there.  Medical problems: Irritable bowel syndrome and acne, not otherwise specified.  The patient currently complains of acute right upper quadrant abdominal pain worsening since yesterday.  Past medical history is remarkable for beta streptococcal urinary tract infection approximately two to three weeks ago. The patient had rounds of both Biaxin and Cipro for this infection and states she  was medically cleared.  The patient also has a history of left lower quadrant pain from ovarian cysts.  The patient had a cholecystectomy in 1989 and a bilateral tubal ligation in 1996.  No history of seizures.  MEDICATIONS: 1. Wellbutrin SR 450 mg p.o. daily. 2. Robinul 2 mg p.o. b.i.d. 3. Doxycycline 100 mg p.o. daily for her acne. 4. Xanax 1 mg p.o. t.i.d.  DRUG ALLERGIES:  No known drug allergies; however, Effexor caused the shakes when she had taken  it in the past.  REVIEW OF SYSTEMS:  The patients primary complaint today is acute left upper quadrant abdominal pain which began yesterday and has gotten progressively worse today.  The patient is denying any fever or chills.  The patient also complains of recent dental pain on the right side of her mouth in upper and lower molars.  PHYSICAL EXAMINATION:  VITAL SIGNS:  Temperature 97.7, pulse 64, respirations 16, blood pressure 90/70.  GENERAL:  The patient is approximately 5 feet 6 inches tall.  She weighed 89 pounds on admission.  She is a cachectic Caucasian female who has long, blonde hair to the middle of her back.  Hygiene is excellent, grooming good.  She is in moderate stress from abdominal pain, walking in a bent over position.  She is able to get up on the table without assistance.  HEAD:  Normocephalic.  Hair: In good condition.  SKIN:  Medium tone and suntanned with no remarkable features.  She does have a cholecystectomy scar in her right upper quadrant.  EENT:  PERRLA.  Lenses are clear.  Sclerae are clear.  EOM: Intact without nystagmus.  Hearing: Intact to normal voice.  No evidence of rhinorrhea. Oropharynx: Noninjected.  Teeth show evidence of extensive repair with caries present on both right and upper left molars.  NECK:  Supple with possibly a very slight thyromegaly.  AC and PC nodes are negative.  CARDIOVASCULAR:  S1 and S2 heard, no clicks, murmurs, or gallops.  Apical pulses synchronal with radial pulse.  No lower extremity edema. Extremities are pink and warm with good capillary refill.  BREAST:  Deferred.  LUNGS:  Clear to auscultation.  The patient is breathing easily.  ABDOMEN:  Bowel sounds present in all four quadrants.  Strong tympanic nodes in all four quadrants.  The patient also feels somewhat bloated.  Abdomen is somewhat rounded in spite of her cachectic appearance.  No tenderness to palpation along the right side of her abdomen, very  slight tenderness along the left side of her abdomen.  She is markedly tender along her left flank.  She denies any history of renal calculi.  No suprapubic tenderness.  NEUROLOGIC:  Cranial nerves II-XII are intact.  EOMs: Intact without nystagmus.  Gait is grossly normal.  Balance is good.  Neurologic is nonfocal.  MUSCULOSKELETAL:  Spine is straight.  Posture is bent over at this point secondary to her abdominal pain.  No evidence of swelling or erythema of any joints.  SOCIAL HISTORY:  The patient is from [place].  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  She has a 38 year old daughter.  She currently lives at home with her daughter in [place].  She is widowed for five years after caring for her husband who had cancer.  FAMILY HISTORY:  The patient denies any family history of mental illness or substance abuse.  MENTAL STATUS EXAMINATION:  This is a cachectic female who is  fully alert in moderate to severe left upper quadrant abdominal pain, poor eye contact.  She is bent over holding her left side and generally she is emotionally withdrawn and aloof.  Speech is soft in tone, normal pace, no spontaneity, generally relevant.  Mood is depressed and she is mildly to moderately anxious.  Thought process is logical and coherent, goal directed.  She does have suicidal ideation and contracts for safety; no evidence of homicidal ideation, no auditory or visual hallucinations.  Cognitive: Intact and oriented x 3. Intelligence is average.  Insight is poor.  Impulse control and judgment are within normal limits.  ADMISSION DIAGNOSES: Axis I:    1. Mood disorder, not otherwise specified.            2. Rule out anxiety disorder. Axis II:   Deferred. Axis III:  1. Abdominal pain, not otherwise specified.            2. Irritable bowel syndrome.            3. Status post urinary tract infection.            4. Acne.             5. Dental caries. Axis IV:   Deferred. Axis V:    Current 29, past year 76.  INITIAL PLAN OF CARE:  Plan is to voluntarily admit the patient to treat her suicidal ideation and her anxiety with a goal of alleviating her suicidal ideation, improving her eating habits, reversing the direction of her weight, and alleviating her sleep disorder and her other neurovegetative symptoms.  We have elected to decrease her Wellbutrin to 150 mg p.o. b.i.d. and will start her on Seroquel 25 mg p.o. q.h.s. and we will probably consider further reducing her Wellbutrin to 150 mg p.o. b.i.d.  Meanwhile, we will put on her Ensure supplements one can t.i.d., weigh her daily.  We will consider a followup for a [facility] consult and we are going to, at this point, transfer her to the emergency room for evaluation of her acute abdominal pain.  For her dental pain, we will give her some analgesia, probably local with some viscous xylocaine and follow her from there and allow her to get a dental appointment.  ESTIMATED LENGTH OF STAY:  Five to six days. Dictated by:   Young Berry Scott, R.N. N.P. Attending Physician:  Jeanice Lim DD:  08/28/01 TD:  08/28/01 Job: [id number]

## 2010-07-02 NOTE — Discharge Summary (Signed)
NAME:  Sheila Hobbs, Sheila Hobbs                          ACCOUNT NO.:  [id number]   MEDICAL RECORD NO.:  [id number]                  PATIENT TYPE:  IPS   LOCATION:  0507                                 FACILITY:  [facility]   PHYSICIAN:  Geoffery Lyons, MD                     DATE OF BIRTH:  1970-06-22   DATE OF ADMISSION:  08/28/2001  DATE OF DISCHARGE:  09/04/2001                                 DISCHARGE SUMMARY   CHIEF COMPLAINT AND PRESENT ILLNESS:  This was the first admission to [facility] for this 40 year old female voluntarily  admitted.  She is a widow.  She was initially admitted for 23 hour with a  history of a 45 pound weight loss over the past nine months.  Her father  became concerned about her not eating and the consistent weight loss and  encouraged her to come to mental health.  She endorsed poor sleep, two or  three hours per night for the past several weeks, possibly months.  She had  positive suicidal ideas, I will not care if I day but I won't try to hurt  myself.  She had decreased appetite, decreased concentration, mild  irritability, vague suicidal ideas, conscious of people looking at her,  denied auditory hallucinations.  She broke up with a boyfriend last autumn  2002 around the time the weight loss started.  She had stress over her  husband's death by squamous cell cancer of the mouth five years ago.  She  denied any panic or mood swings.   PAST PSYCHIATRIC HISTORY:  She sees Dr. Clarisse Gouge.  This is her first  inpatient stay.  She has not seen a psychiatrist in the past.  She had been  seeing her primary physician, trials of Paxil, Prozac, Effexor, and Zyprexa.   SUBSTANCE ABUSE HISTORY:  She denies the use or abuse of any substances.   PAST MEDICAL HISTORY:  Dr. Philbert Riser saw her at the GI clinic for abdominal  pain.  She had irritable bowel syndrome, acne.   MEDICATIONS:  1. Wellbutrin slow release 450 mg daily.  2. Robinul 2 mg twice a  day.  3. Doxycycline 100 mg daily for acne.  4. Xanax 1 mg three times a day.   PHYSICAL EXAMINATION:  Physical examination was performed, failed to show  any acute findings.   MENTAL STATUS EXAM:  Mental status exam revealed an underweight female,  fully alert, moderate to severe left upper quadrant abdominal pain, poor eye  contact, bent over holding her left side, emotionally withdrawn and aloof.  Speech was soft in tone, normal pace, no spontaneity.  Mood was depressed  and anxious.  Affect was depressed and anxious.  Thought processes were  logical, goal directed, relevant; some suicidal ideas, no active plan, can  contract for safety.  Cognitive: Cognition was well preserved.   ADMISSION  DIAGNOSES:  Axis I: 1. Major depression, single episode.  1. Anxiety disorder, not otherwise specified.  Axis II:    No diagnosis.  Axis III:   1. Abdominal pain.  1. Irritable bowel syndrome.  2. Symptoms of positive urinary tract infection.  3. Acne.  Axis IV:    Moderate.  Axis V:     Global assessment of functioning upon admission 29, highest  global assessment of functioning in the last year 64.   LABORATORY DATA:  CBC was within normal limits.  Blood chemistries were  within normal limits.  Liver profile was within normal limits.  Thyroid  profile was within normal limits.  Urine pregnancy test was negative.  Drug  screen was positive for benzodiazepines.   HOSPITAL COURSE:  She was admitted and started in individual and group  psychotherapy.  She was kept on her Wellbutrin and she was kept on the  Xanax.  She was given the Robinul and the doxycycline.  She continued to  have left upper quadrant pain.  She was transferred to [facility] where she  was evaluated.  She was recommended Zelnorm 2 mg twice a day.  Due to the  persistent ruminations and the underlying anxiety, she was started on Zoloft  25 mg per day.  She had some urinary retention; she was placed on a Foley  catheter  that was eventually removed.  Due to the persistent depression and  the ruminations, ruminating about her gastrointestinal and urinary tract,  she claimed that she felt her body was killing her.  She was holding onto  the grief of her husband but having hard time letting go of the ruminations  so she was started on Risperdal 0.25 mg twice a day.  Her physical symptoms  were evaluated several times, a whole workup was ordered; all the workup was  negative.  We went ahead and increased the Zoloft to 100 mg per day.  On  July 22, she was better, had obtained full benefit from the stay, no active  suicidal ideas, feeling the need of further stabilization but she was  willing to follow-up on an outpatient basis.  There were no suicidal ideas,  no homicidal ideas, she was starting to eat, and she was feeling somewhat  better, we went ahead and discharged her to outpatient followup.   DISCHARGE DIAGNOSES:  Axis I: 1. Major depression, recurrent.  1. Anxiety disorder, not otherwise specified.  Axis II:    No diagnosis.  Axis III:   1. Abdominal pain.  1. Irritable bowel syndrome.  2. Status post urinary tract infection.  3. Acne.  Axis IV:    Moderate.  Axis V:     Global assessment of functioning upon discharge 55.   DISCHARGE MEDICATIONS:  1. Wellbutrin slow release 150 mg twice a day.  2. Xanax 1 mg three times a day.  3. Zelnorm 2 mg twice a day.  4. Risperdal 0.25 mg twice a day.  5. Zoloft 100 mg daily.  6. Trazodone at bedtime for sleep.   FOLLOW UP:  She will followup with [facility].                                                 Geoffery Lyons, MD    IL/MEDQ  D:  10/03/2001  T:  10/06/2001  Job:  [id number]

## 2010-11-29 LAB — CBC
HCT: 42.3
Hemoglobin: 14.1
RBC: 4.44
WBC: 10.6 — ABNORMAL HIGH

## 2010-11-29 LAB — COMPREHENSIVE METABOLIC PANEL
ALT: 10
Alkaline Phosphatase: 52
BUN: 4 — ABNORMAL LOW
CO2: 24
Chloride: 107
GFR calc non Af Amer: >60
Glucose, Bld: 99
Potassium: 3.9
Sodium: 138
Total Bilirubin: 1
Total Protein: 7.2

## 2010-11-29 LAB — BASIC METABOLIC PANEL
BUN: 2 — ABNORMAL LOW
BUN: 5 — ABNORMAL LOW
Calcium: 8.3 — ABNORMAL LOW
Creatinine, Ser: 0.48
GFR calc Af Amer: >60
GFR calc non Af Amer: >60
GFR calc non Af Amer: >60
Glucose, Bld: 85
Potassium: 4.1
Sodium: 140

## 2010-11-29 LAB — RAPID URINE DRUG SCREEN, HOSP PERFORMED
Amphetamines: NOT DETECTED
Barbiturates: NOT DETECTED
Benzodiazepines: POSITIVE — AB
Cocaine: NOT DETECTED

## 2010-11-29 LAB — DIFFERENTIAL
Basophils Absolute: 0
Basophils Relative: 0
Eosinophils Absolute: 0.1
Monocytes Relative: 6
Neutro Abs: 8.1 — ABNORMAL HIGH
Neutrophils Relative %: 76

## 2010-11-29 LAB — URINALYSIS, ROUTINE W REFLEX MICROSCOPIC
Bilirubin Urine: NEGATIVE
Bilirubin Urine: NEGATIVE
Glucose, UA: NEGATIVE
Ketones, ur: 40 — AB
Leukocytes, UA: NEGATIVE
Protein, ur: NEGATIVE
Urobilinogen, UA: 0.2
pH: 6

## 2010-11-29 LAB — LIPASE, BLOOD: Lipase: 14

## 2010-11-29 LAB — ETHANOL: Alcohol, Ethyl (B): <5

## 2010-11-29 LAB — URINE MICROSCOPIC-ADD ON

## 2010-11-29 LAB — AMYLASE: Amylase: 64

## 2010-11-30 LAB — DIFFERENTIAL
Basophils Absolute: 0
Basophils Absolute: 0.1
Basophils Relative: 0
Eosinophils Absolute: 0.1
Eosinophils Absolute: 0.2
Eosinophils Relative: 1
Eosinophils Relative: 4
Lymphocytes Relative: 9 — ABNORMAL LOW
Lymphs Abs: 1.5
Lymphs Abs: 2.3
Monocytes Absolute: 0.5
Monocytes Relative: 3
Neutro Abs: 14.1 — ABNORMAL HIGH
Neutrophils Relative %: 87 — ABNORMAL HIGH

## 2010-11-30 LAB — COMPREHENSIVE METABOLIC PANEL
ALT: 8
AST: 12
CO2: 26
Chloride: 109
GFR calc Af Amer: >60
GFR calc non Af Amer: >60
Sodium: 138
Total Bilirubin: 0.6

## 2010-11-30 LAB — RAPID URINE DRUG SCREEN, HOSP PERFORMED
Amphetamines: NOT DETECTED
Barbiturates: NOT DETECTED
Benzodiazepines: POSITIVE — AB
Cocaine: NOT DETECTED
Opiates: NOT DETECTED
Tetrahydrocannabinol: NOT DETECTED

## 2010-11-30 LAB — URINE CULTURE
Colony Count: NO GROWTH
Special Requests: NEGATIVE

## 2010-11-30 LAB — BASIC METABOLIC PANEL
BUN: 3 — ABNORMAL LOW
CO2: 24
Chloride: 106
Creatinine, Ser: 0.71
GFR calc Af Amer: >60
Glucose, Bld: 114 — ABNORMAL HIGH

## 2010-11-30 LAB — CULTURE, BLOOD (ROUTINE X 2)
Culture: NO GROWTH
Culture: NO GROWTH
Report Status: 7152008

## 2010-11-30 LAB — URINALYSIS, ROUTINE W REFLEX MICROSCOPIC
Bilirubin Urine: NEGATIVE
Bilirubin Urine: NEGATIVE
Glucose, UA: NEGATIVE
Hgb urine dipstick: NEGATIVE
Ketones, ur: NEGATIVE
Leukocytes, UA: NEGATIVE
Protein, ur: NEGATIVE
Protein, ur: NEGATIVE
Urobilinogen, UA: 0.2

## 2010-11-30 LAB — URINE MICROSCOPIC-ADD ON

## 2010-11-30 LAB — CBC
MCHC: 33.6
MCV: 96.8
RBC: 3.54 — ABNORMAL LOW
RBC: 3.94
WBC: 6.8

## 2018-06-13 ENCOUNTER — Ambulatory Visit: Payer: Self-pay | Admitting: Family Medicine

## 2018-06-15 ENCOUNTER — Other Ambulatory Visit: Payer: Self-pay

## 2018-06-15 ENCOUNTER — Ambulatory Visit (INDEPENDENT_AMBULATORY_CARE_PROVIDER_SITE_OTHER): Payer: Self-pay | Admitting: Family Medicine

## 2018-06-15 ENCOUNTER — Encounter: Payer: Self-pay | Admitting: Family Medicine

## 2018-06-15 VITALS — BP 106/74 | HR 98 | Temp 98.6°F | Resp 16 | Ht 65.0 in | Wt 129.0 lb

## 2018-06-15 DIAGNOSIS — H6982 Other specified disorders of Eustachian tube, left ear: Secondary | ICD-10-CM

## 2018-06-15 MED ORDER — FLUTICASONE PROPIONATE 50 MCG/ACT NA SUSP: 2.0000 | Freq: Every day | NASAL | 2 refills | Status: DC

## 2018-06-15 NOTE — Progress Notes (Signed)
Subjective:    Patient ID: Sheila Hobbs, female    DOB: 12/01/1970, 48 y.o.   MRN: [id number]  Sheila Hobbs is a 48 y.o. female presenting on 06/15/2018 for Establish Care (left side earache onset couple of weeks)  Here to establish with new PCP. Here for acute visit.  HPI   Left Ear Pain / Eustachian Tube Dysfunction Reports symptoms started 2-3 weeks ago with Left ear feeling irritated and full, she thought it was ear wax, used OTC Debrox wax kit and used drops and ear flushing. She said nothing came out and it did not resolve her symptoms. 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 Wind has bothered her ear recently - Not using anti histamine or nasal spray, used many years ago only Admits occasional sinus congestion and eye allergy symptoms with known seasonal environmental allergies - Denies any fevers chills, sweats, nausea vomiting, ear drainage, sinus drainage, hearing loss   Depression screen [facility] 2/9 06/15/2018  Decreased Interest 0  Down, Depressed, Hopeless 0  PHQ - 2 Score 0    Past Medical History:  Diagnosis Date  . Allergy   . Headache    Past Surgical History:  Procedure Laterality Date  . CHOLECYSTECTOMY    . TONSILECTOMY/ADENOIDECTOMY WITH MYRINGOTOMY     Social History   Socioeconomic History  . Marital status: Married    Spouse name: Not on file  . Number of children: Not on file  . Years of education: Not on file  . Highest education level: Not on file  Occupational History  . Not on file  Social Needs  . Financial resource strain: Not on file  . Food insecurity:    Worry: Not on file    Inability: Not on file  . Transportation needs:    Medical: Not on file    Non-medical: Not on file  Tobacco Use  . Smoking status: Current Every Day Smoker  . Smokeless tobacco:  Current User  Substance and Sexual Activity  . Alcohol use: Yes    Frequency: Never  . Drug use: Never  . Sexual activity: Not on file  Lifestyle  . Physical activity:    Days per week: Not on file    Minutes per session: Not on file  . Stress: Not on file  Relationships  . Social connections:    Talks on phone: Not on file    Gets together: Not on file    Attends religious service: Not on file    Active member of club or organization: Not on file    Attends meetings of clubs or organizations: Not on file    Relationship status: Not on file  . Intimate partner violence:    Fear of current or ex partner: Not on file    Emotionally abused: Not on file    Physically abused: Not on file    Forced sexual activity: Not on file  Other Topics Concern  . Not on file  Social History Narrative  . Not on file   Family History  Problem Relation Age of Onset  . Diabetes Mother   . Cancer Mother        thyroid  . Heart disease Mother        CHF   Current Outpatient Medications  on File Prior to Visit  Medication Sig  . ibuprofen (ADVIL) 200 MG tablet Take 200 mg by mouth every 6 (six) hours as needed.   No current facility-administered medications on file prior to visit.     Review of Systems Per HPI unless specifically indicated above     Objective:    BP 106/74   Pulse 98   Temp 98.6 F (37 C) (Oral)   Resp 16   Ht '5\' 5"'  (1.651 m)   Wt 129 lb (58.5 kg)   BMI 21.47 kg/m   Wt Readings from Last 3 Encounters:  06/15/18 129 lb (58.5 kg)    Physical Exam Vitals signs and nursing note reviewed.  Constitutional:      General: She is not in acute distress.    Appearance: She is well-developed. She is not diaphoretic.     Comments: Well-appearing, comfortable, cooperative  HENT:     Head: Normocephalic and atraumatic.     Comments: Frontal / maxillary sinuses non-tender.  LEFT TM with opaque appearance some fluid loss of landmarks some fullness, narrow ear canals,  minimal cerumen non impacted. RIGHT TM is clear and normal landmarks, no erythema no effusion, no cerumen.  Eyes:     General:        Right eye: No discharge.        Left eye: No discharge.     Conjunctiva/sclera: Conjunctivae normal.  Cardiovascular:     Rate and Rhythm: Normal rate.  Pulmonary:     Effort: Pulmonary effort is normal.  Skin:    General: Skin is warm and dry.     Findings: No erythema or rash.  Neurological:     Mental Status: She is alert and oriented to person, place, and time.  Psychiatric:        Behavior: Behavior normal.     Comments: Well groomed, good eye contact, normal speech and thoughts            Assessment & Plan:   Problem List Items Addressed This Visit    None    Visit Diagnoses    Acute dysfunction of Eustachian tube, left    -  Primary   Relevant Medications   fluticasone (FLONASE) 50 MCG/ACT nasal spray      Acute vs Subacute on L eustachian tube dysfunction with secondary L ear effusion, without loss of hearing or evidence of AOM or sinusitis. Likely related allergic rhinosinusitis based on exam and history. - Inadequate conservative therapy   Also likely factor with her occupation wearing headset and frequent ear plugs, may warrant ENT in future if unresolved.  Plan: 1. Start Flonase 2 sprays each nare daily for up to 4-6 weeks or longer 2. Start OTC oral anti-histamine daily 3. Recommend may consider trial OTC oral decongestant for up to 1 week or less - try sudafed 4. AVOID Afrin nasal decongestant, counseled if uses, max dose up to 3 days or less, avoid rebound rhinitis 5. Handout with info on Galbreath technique 6. Additionally discussed potential benefit of possible antibiotic if needed in future vs oral steroid, prednisone taper for short course up to 46m daily x 3 days, only if refractory symptoms, counseled on potential side effects 7. Follow-up if not improved or worsening, return criteria    Meds ordered this  encounter  Medications  . fluticasone (FLONASE) 50 MCG/ACT nasal spray    Sig: Place 2 sprays into both nostrils daily. Use for 4-6 weeks then stop and use seasonally or  as needed.    Dispense:  16 g    Refill:  2    Follow up plan: Return if symptoms worsen or fail to improve, for sinus / ear.  Nobie Putnam, DO [facility] 06/15/2018, 1:50 PM

## 2018-06-15 NOTE — Patient Instructions (Addendum)
You have some Eustachian Tube Dysfunction, this problem is usually caused by some deeper sinus swelling and pressure, causing difficulty of eustachian tubes to clear fluid from behind ear drum. You can have ear pain, pressure, fullness, loss of hearing. Often related to sinus symptoms and sometimes with sinusitis or infection or allergy symptoms.  Treatment: - Start using nasal steroid spray, Flonase 2 sprays in each nostril every day for at least 4-6 weeks, and maybe longer - Start OTC allergy medicine (Claritin, Zyrtec, or Allegra - or generics) once daily - For significant ear/sinus pressure, try OTC decongestant prefer BEHIND THE COUNTER - Original Sudafed  - AVOID Afrin nasal spray, if you use this do not use more than 3 days or can make sinus swelling worse  Look into "Galbreath Technique" for ear effusion, can do this simple massage 1-2 x daily for few days then stop. And use as needed. May also use a warm moist wash cloth to help loosen up this tissue before or after doing this to help open up eustachian tube.  If any significant worsening, loss of hearing, constant pain, fever/chills, or concern for infection - notify office and we can send in an antibiotic. Or if just persistent pressure that is not improving, you may contact me back within 1 week and we can consider a brief course of oral steroid prednisone for 3 days only to help reduce swelling, as discussed this is not ideal treatment and can cause side effects.  Please schedule a Follow-up Appointment to: Return if symptoms worsen or fail to improve, for sinus / ear.  If you have any other questions or concerns, please feel free to call the office or send a message through MyChart. You may also schedule an earlier appointment if necessary.  Additionally, you may be receiving a survey about your experience at our office within a few days to 1 week by e-mail or mail. We value your feedback.  Saralyn Pilar, DO [facility], [place]

## 2018-10-01 ENCOUNTER — Other Ambulatory Visit: Payer: Self-pay | Admitting: Family Medicine

## 2018-10-01 DIAGNOSIS — H6982 Other specified disorders of Eustachian tube, left ear: Secondary | ICD-10-CM

## 2018-10-01 MED ORDER — FLUTICASONE PROPIONATE 50 MCG/ACT NA SUSP: 2.0000 | Freq: Every day | NASAL | 5 refills | Status: DC

## 2018-10-11 ENCOUNTER — Other Ambulatory Visit: Payer: Self-pay

## 2018-10-11 ENCOUNTER — Encounter: Payer: Self-pay | Admitting: Emergency Medicine

## 2018-10-11 ENCOUNTER — Emergency Department: Payer: BC Managed Care – PPO

## 2018-10-11 DIAGNOSIS — F17210 Nicotine dependence, cigarettes, uncomplicated: Secondary | ICD-10-CM | POA: Insufficient documentation

## 2018-10-11 DIAGNOSIS — F17228 Nicotine dependence, chewing tobacco, with other nicotine-induced disorders: Secondary | ICD-10-CM | POA: Diagnosis not present

## 2018-10-11 DIAGNOSIS — R079 Chest pain, unspecified: Secondary | ICD-10-CM | POA: Insufficient documentation

## 2018-10-11 LAB — CBC
HCT: 43.2 % (ref 36.0–46.0)
Hemoglobin: 14.8 g/dL (ref 12.0–15.0)
MCH: 33.1 pg (ref 26.0–34.0)
MCHC: 34.3 g/dL (ref 30.0–36.0)
MCV: 96.6 fL (ref 80.0–100.0)
Platelets: 345 10*3/uL (ref 150–400)
RBC: 4.47 MIL/uL (ref 3.87–5.11)
RDW: 13 % (ref 11.5–15.5)
WBC: 10.1 10*3/uL (ref 4.0–10.5)
nRBC: 0 % (ref 0.0–0.2)

## 2018-10-11 LAB — BASIC METABOLIC PANEL
Anion gap: 10 (ref 5–15)
BUN: 11 mg/dL (ref 6–20)
CO2: 23 mmol/L (ref 22–32)
Calcium: 9.9 mg/dL (ref 8.9–10.3)
Chloride: 105 mmol/L (ref 98–111)
Creatinine, Ser: 0.67 mg/dL (ref 0.44–1.00)
GFR calc Af Amer: >60 mL/min (ref >60)
GFR calc non Af Amer: >60 mL/min (ref >60)
Glucose, Bld: 100 mg/dL — ABNORMAL HIGH (ref 70–99)
Potassium: 3.8 mmol/L (ref 3.5–5.1)
Sodium: 138 mmol/L (ref 135–145)

## 2018-10-11 LAB — TROPONIN I (HIGH SENSITIVITY): Troponin I (High Sensitivity): <2 ng/L (ref <18)

## 2018-10-11 NOTE — ED Triage Notes (Signed)
Pt c/o central chest pain that radiates under bilateral breast, starting a 1500 today. Pt reports SOB when intermittent pain occurs, denies N/V.

## 2018-10-12 ENCOUNTER — Emergency Department
Admission: EM | Admit: 2018-10-12 | Discharge: 2018-10-12 | Disposition: A | Discharge status: Home / Self Care | Payer: BC Managed Care – PPO | Attending: Emergency Medicine | Admitting: Emergency Medicine

## 2018-10-12 DIAGNOSIS — R079 Chest pain, unspecified: Secondary | ICD-10-CM

## 2018-10-12 LAB — FIBRIN DERIVATIVES D-DIMER (ARMC ONLY): Fibrin derivatives D-dimer (ARMC): 206.63 ng/mL (FEU) (ref 0.00–499.00)

## 2018-10-12 LAB — TROPONIN I (HIGH SENSITIVITY): Troponin I (High Sensitivity): <2 ng/L (ref <18)

## 2018-10-12 MED ORDER — ALUM & MAG HYDROXIDE-SIMETH 200-200-20 MG/5ML PO SUSP
30.0000 mL | Freq: Once | ORAL | Status: AC
  Administered 2018-10-12: 03:00:00 30 mL via ORAL
  Filled 2018-10-12: qty 30

## 2018-10-12 MED ORDER — LIDOCAINE VISCOUS HCL 2 % MT SOLN
15.0000 mL | Freq: Once | OROMUCOSAL | Status: AC
  Administered 2018-10-12: 15 mL via ORAL
  Filled 2018-10-12: qty 15

## 2018-10-12 NOTE — ED Provider Notes (Signed)
[facility] Emergency Department Provider Note   First MD Initiated Contact with Patient 10/12/18 0246     (approximate)  I have reviewed the triage vital signs and the nursing notes.   HISTORY  Chief Complaint Chest Pain    HPI Sheila Hobbs is a 48 y.o. female with below listed previous medical conditions presents to the emergency department secondary to central chest pain that patient states radiates under bilateral breasts beginning at 3:00 PM this afternoon.  Patient states that the pain is intermittent when it does occur.  Patient denies any nausea or vomiting.  Patient describes the pain as cramp-like.  Patient denies any lower extremity pain or swelling.  Patient denies chest pain or dyspnea at present.  Of note patient has a familial history (mother) with history of CAD and MI before the age of 70.  No familial history of DVT       Past Medical History:  Diagnosis Date  . Allergy   . Headache     Patient Active Problem List   Diagnosis Date Noted  . HYPERLIPIDEMIA 09/16/2009  . ACUTE MAXILLARY SINUSITIS 09/16/2009    Past Surgical History:  Procedure Laterality Date  . CHOLECYSTECTOMY  1989  . TONSILECTOMY/ADENOIDECTOMY WITH MYRINGOTOMY  1997    Prior to Admission medications   Medication Sig Start Date End Date Taking? Authorizing Provider  fluticasone (FLONASE) 50 MCG/ACT nasal spray Place 2 sprays into both nostrils daily. Use for 4-6 weeks then stop and use seasonally or as needed. 10/01/18   Karamalegos, Devonne Doughty, DO  ibuprofen (ADVIL) 200 MG tablet Take 200 mg by mouth every 6 (six) hours as needed.    [provider]    Allergies Codeine  Family History  Problem Relation Age of Onset  . Diabetes Mother   . Heart disease Mother        CHF  . Thyroid cancer Mother     Social History Social History   Tobacco Use  . Smoking status: Current Every Day Smoker    Packs/day: 1.00    Years: 30.00    Pack  years: 30.00    Types: Cigarettes  . Smokeless tobacco: Current User  Substance Use Topics  . Alcohol use: Yes    Frequency: Never  . Drug use: Never    Review of Systems Constitutional: No fever/chills Eyes: No visual changes. ENT: No sore throat. Cardiovascular: Positive for chest pain. Respiratory: Denies shortness of breath. Gastrointestinal: No abdominal pain.  No nausea, no vomiting.  No diarrhea.  No constipation. Genitourinary: Negative for dysuria. Musculoskeletal: Negative for neck pain.  Negative for back pain. Integumentary: Negative for rash. Neurological: Negative for headaches, focal weakness or numbness.  ____________________________________________   PHYSICAL EXAM:  VITAL SIGNS: ED Triage Vitals  Enc Vitals Group     BP 10/11/18 2007 137/77     Pulse Rate 10/11/18 2007 93     Resp 10/11/18 2007 17     Temp 10/11/18 2007 99 F (37.2 C)     Temp Source 10/11/18 2007 Oral     SpO2 10/11/18 2007 99 %     Weight --      Height --      Head Circumference --      Peak Flow --      Pain Score 10/12/18 0123 4     Pain Loc --      Pain Edu? --      Excl. in [place]? --  Constitutional: Alert and oriented.  Eyes: Conjunctivae are normal.  Mouth/Throat: Mucous membranes are moist. Neck: No stridor.  No meningeal signs.   Cardiovascular: Normal rate, regular rhythm. Good peripheral circulation. Grossly normal heart sounds. Respiratory: Normal respiratory effort.  No retractions. Gastrointestinal: Soft and nontender. No distention.  Musculoskeletal: No lower extremity tenderness nor edema. No gross deformities of extremities. Neurologic:  Normal speech and language. No gross focal neurologic deficits are appreciated.  Skin:  Skin is warm, dry and intact. Psychiatric: Mood and affect are normal. Speech and behavior are normal.  ____________________________________________   LABS (all labs ordered are listed, but only abnormal results are displayed)  Labs  Reviewed  BASIC METABOLIC PANEL - Abnormal; Notable for the following components:      Result Value   Glucose, Bld 100 (*)    All other components within normal limits  CBC  FIBRIN DERIVATIVES D-DIMER ([facility] ONLY)  POC URINE PREG, ED  TROPONIN I (HIGH SENSITIVITY)  TROPONIN I (HIGH SENSITIVITY)   ____________________________________________  EKG ED ECG REPORT I, [place] N Gabriellah Rabel, the attending physician, personally viewed and interpreted this ECG.   Date: 10/11/2018  EKG Time: 8:00 PM  Rate: 95  Rhythm: Normal sinus rhythm  Axis: Normal  Intervals: Normal  ST&T Change: Inferior ST segment depression   RADIOLOGY I, [place] N Karis Emig, personally viewed and evaluated these images (plain radiographs) as part of my medical decision making, as well as reviewing the written report by the radiologist.  ED MD interpretation: No active cardiopulmonary disease noted on chest x-ray per radiologist.  Official radiology report(s): Dg Chest 2 View  Result Date: 10/11/2018 CLINICAL DATA:  Pt states central chest pain since early today. Sob when pain occurs. No hx of pneumothorax conditions or surgery. Current smoker. EXAM: CHEST - 2 VIEW COMPARISON:  Chest x-rays 09/07/2006, 08/24/2006 FINDINGS: The heart size and mediastinal contours are within normal limits. The lungs are clear. No pneumothorax or pleural effusion. The visualized skeletal structures are unremarkable. IMPRESSION: No active cardiopulmonary disease. Electronically Signed   By: Emmaline KluverNancy  Ballantyne M.D.   On: 10/11/2018 20:29    Procedures   ____________________________________________   INITIAL IMPRESSION / MDM / ASSESSMENT AND PLAN / ED COURSE  As part of my medical decision making, I reviewed the following data within the electronic MEDICAL RECORD NUMBER  48 year old female presents with above-stated history and physical exam secondary to intermittent chest discomfort.  Considered possibly CAD/MI and as such EKG performed which  revealed no evidence of infarction.  Troponin negative x2 d-dimer also negative and a low risk PE patient.  Consider possibly of angina given ST segment depression.  Spoke with the patient at length regarding necessity of following up with cardiology for outpatient stress test.  Patient is chest pain-free at present and agreeable with plan  ____________________________________________  FINAL CLINICAL IMPRESSION(S) / ED DIAGNOSES  Final diagnoses:  Chest pain, unspecified type     MEDICATIONS GIVEN DURING THIS VISIT:  Medications  alum & mag hydroxide-simeth (MAALOX/MYLANTA) 200-200-20 MG/5ML suspension 30 mL (30 mLs Oral Given 10/12/18 0251)    And  lidocaine (XYLOCAINE) 2 % viscous mouth solution 15 mL (15 mLs Oral Given 10/12/18 0251)     ED Discharge Orders    None      *Please note:  Marina GravelCrissy J Crear was evaluated in Emergency Department on 10/12/2018 for the symptoms described in the history of present illness. She was evaluated in the context of the global COVID-19 pandemic, which necessitated consideration that  the patient might be at risk for infection with the SARS-CoV-2 virus that causes COVID-19. Institutional protocols and algorithms that pertain to the evaluation of patients at risk for COVID-19 are in a state of rapid change based on information released by regulatory bodies including the CDC and federal and state organizations. These policies and algorithms were followed during the patient's care in the ED.  Some ED evaluations and interventions may be delayed as a result of limited staffing during the pandemic.*  Note:  This document was prepared using Dragon voice recognition software and may include unintentional dictation errors.   Darci Current, MD 10/12/18 [phone]

## 2019-05-18 ENCOUNTER — Other Ambulatory Visit: Payer: Self-pay | Admitting: Family Medicine

## 2019-05-18 DIAGNOSIS — H6982 Other specified disorders of Eustachian tube, left ear: Secondary | ICD-10-CM

## 2019-12-11 ENCOUNTER — Other Ambulatory Visit: Payer: Self-pay | Admitting: Family Medicine

## 2019-12-11 DIAGNOSIS — H6982 Other specified disorders of Eustachian tube, left ear: Secondary | ICD-10-CM

## 2019-12-11 DIAGNOSIS — H6992 Unspecified Eustachian tube disorder, left ear: Secondary | ICD-10-CM

## 2019-12-11 NOTE — Telephone Encounter (Signed)
Left message for pt. To make appointment. Courtesy refill. °

## 2020-01-04 ENCOUNTER — Other Ambulatory Visit: Payer: Self-pay | Admitting: Family Medicine

## 2020-01-04 DIAGNOSIS — H6982 Other specified disorders of Eustachian tube, left ear: Secondary | ICD-10-CM

## 2021-04-14 DIAGNOSIS — D0439 Carcinoma in situ of skin of other parts of face: Secondary | ICD-10-CM | POA: Diagnosis not present

## 2021-08-28 DIAGNOSIS — H00014 Hordeolum externum left upper eyelid: Secondary | ICD-10-CM | POA: Diagnosis not present

## 2021-10-27 DIAGNOSIS — D2261 Melanocytic nevi of right upper limb, including shoulder: Secondary | ICD-10-CM | POA: Diagnosis not present

## 2021-10-27 DIAGNOSIS — D225 Melanocytic nevi of trunk: Secondary | ICD-10-CM | POA: Diagnosis not present

## 2021-10-27 DIAGNOSIS — D2272 Melanocytic nevi of left lower limb, including hip: Secondary | ICD-10-CM | POA: Diagnosis not present

## 2021-10-27 DIAGNOSIS — Z85828 Personal history of other malignant neoplasm of skin: Secondary | ICD-10-CM | POA: Diagnosis not present

## 2022-05-02 DIAGNOSIS — L820 Inflamed seborrheic keratosis: Secondary | ICD-10-CM | POA: Diagnosis not present

## 2022-05-02 DIAGNOSIS — R208 Other disturbances of skin sensation: Secondary | ICD-10-CM | POA: Diagnosis not present

## 2022-05-02 DIAGNOSIS — L538 Other specified erythematous conditions: Secondary | ICD-10-CM | POA: Diagnosis not present

## 2022-06-24 DIAGNOSIS — J069 Acute upper respiratory infection, unspecified: Secondary | ICD-10-CM | POA: Diagnosis not present

## 2022-06-24 DIAGNOSIS — H6691 Otitis media, unspecified, right ear: Secondary | ICD-10-CM | POA: Diagnosis not present

## 2022-06-29 DIAGNOSIS — L570 Actinic keratosis: Secondary | ICD-10-CM | POA: Diagnosis not present

## 2022-06-29 DIAGNOSIS — D2261 Melanocytic nevi of right upper limb, including shoulder: Secondary | ICD-10-CM | POA: Diagnosis not present

## 2022-06-29 DIAGNOSIS — D2262 Melanocytic nevi of left upper limb, including shoulder: Secondary | ICD-10-CM | POA: Diagnosis not present

## 2022-06-29 DIAGNOSIS — Z85828 Personal history of other malignant neoplasm of skin: Secondary | ICD-10-CM | POA: Diagnosis not present

## 2022-06-29 DIAGNOSIS — D2272 Melanocytic nevi of left lower limb, including hip: Secondary | ICD-10-CM | POA: Diagnosis not present

## 2022-07-04 DIAGNOSIS — J069 Acute upper respiratory infection, unspecified: Secondary | ICD-10-CM | POA: Diagnosis not present

## 2022-08-29 DIAGNOSIS — M5416 Radiculopathy, lumbar region: Secondary | ICD-10-CM | POA: Diagnosis not present

## 2022-09-19 DIAGNOSIS — M5416 Radiculopathy, lumbar region: Secondary | ICD-10-CM | POA: Diagnosis not present

## 2022-09-30 DIAGNOSIS — M5416 Radiculopathy, lumbar region: Secondary | ICD-10-CM | POA: Diagnosis not present

## 2022-10-14 DIAGNOSIS — M5416 Radiculopathy, lumbar region: Secondary | ICD-10-CM | POA: Diagnosis not present

## 2022-10-20 DIAGNOSIS — M5416 Radiculopathy, lumbar region: Secondary | ICD-10-CM | POA: Diagnosis not present

## 2022-11-04 DIAGNOSIS — M5416 Radiculopathy, lumbar region: Secondary | ICD-10-CM | POA: Diagnosis not present

## 2022-11-11 DIAGNOSIS — M5416 Radiculopathy, lumbar region: Secondary | ICD-10-CM | POA: Diagnosis not present

## 2022-11-17 DIAGNOSIS — M5416 Radiculopathy, lumbar region: Secondary | ICD-10-CM | POA: Diagnosis not present

## 2022-11-17 DIAGNOSIS — M51369 Other intervertebral disc degeneration, lumbar region without mention of lumbar back pain or lower extremity pain: Secondary | ICD-10-CM | POA: Diagnosis not present

## 2022-12-16 DIAGNOSIS — M5416 Radiculopathy, lumbar region: Secondary | ICD-10-CM | POA: Diagnosis not present

## 2023-01-19 DIAGNOSIS — M51362 Other intervertebral disc degeneration, lumbar region with discogenic back pain and lower extremity pain: Secondary | ICD-10-CM | POA: Diagnosis not present

## 2023-02-20 ENCOUNTER — Ambulatory Visit (INDEPENDENT_AMBULATORY_CARE_PROVIDER_SITE_OTHER): Payer: BC Managed Care – PPO | Admitting: Family Medicine

## 2023-02-20 ENCOUNTER — Encounter: Payer: Self-pay | Admitting: Family Medicine

## 2023-02-20 ENCOUNTER — Other Ambulatory Visit: Payer: Self-pay | Admitting: Family Medicine

## 2023-02-20 VITALS — BP 122/78 | HR 77 | Ht 65.0 in | Wt 137.0 lb

## 2023-02-20 DIAGNOSIS — R7309 Other abnormal glucose: Secondary | ICD-10-CM

## 2023-02-20 DIAGNOSIS — M51362 Other intervertebral disc degeneration, lumbar region with discogenic back pain and lower extremity pain: Secondary | ICD-10-CM | POA: Diagnosis not present

## 2023-02-20 DIAGNOSIS — G8929 Other chronic pain: Secondary | ICD-10-CM | POA: Diagnosis not present

## 2023-02-20 DIAGNOSIS — Z0000 Encounter for general adult medical examination without abnormal findings: Secondary | ICD-10-CM

## 2023-02-20 DIAGNOSIS — M5441 Lumbago with sciatica, right side: Secondary | ICD-10-CM

## 2023-02-20 DIAGNOSIS — E7800 Pure hypercholesterolemia, unspecified: Secondary | ICD-10-CM

## 2023-02-20 MED ORDER — GABAPENTIN 100 MG PO CAPS: ORAL_CAPSULE | ORAL | 1 refills | Status: AC

## 2023-02-20 MED ORDER — NAPROXEN 500 MG PO TABS: 500.0000 mg | ORAL_TABLET | Freq: Two times a day (BID) | ORAL | 2 refills | Status: AC

## 2023-02-20 MED ORDER — BACLOFEN 10 MG PO TABS: 5.0000 mg | ORAL_TABLET | Freq: Three times a day (TID) | ORAL | 1 refills | Status: AC | PRN

## 2023-02-20 NOTE — Progress Notes (Signed)
 Subjective:    Patient ID: Sheila Hobbs, female    DOB: [birth year], 53 y.o.   MRN: [id number]  Sheila Hobbs is a 53 y.o. female presenting on 02/20/2023 for Establish Care   HPI  Discussed the use of AI scribe software for clinical note transcription with the patient, who gave verbal consent to proceed.  History of Present Illness    Re-establish care. Last visit with me 2020.   Chronic Low Back Pain Lumbar DDD, Radiculopathy / Right Sciatica Followed by [facility], Dr Marchia, Mliss Ravens FNP, Dr Wanda  The patient, with a history of back pain, presents with worsening symptoms over the past several months. The pain, described as a burning sensation, is exacerbated with movement and has led to significant functional impairment. The patient reports that the pain is so severe that it often brings her to tears. The discomfort is primarily located in the lower back and radiates down the right leg, suggestive of radiculopathy. The patient has been managing the pain with over-the-counter ibuprofen and Tylenol PM for sleep, but reports minimal relief.  She has pursued spinal ESI injections She does topical treatments, hot, cold, and Physical Therapy Recent URI with coughing straining her low back pain  Work accommodation paper work for sit/stand test requested. And lumbar support/pillow   In addition to the back pain, the patient has been managing heartburn with over-the-counter omeprazole. 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          02/20/2023   10:00 AM 06/15/2018    1:42 PM  Depression screen PHQ 2/9  Decreased Interest 0 0  Down, Depressed, Hopeless 0 0  PHQ - 2 Score 0 0       02/20/2023   10:00 AM  GAD 7 : Generalized Anxiety Score  Nervous, Anxious, on Edge 0  Control/stop worrying 0  Worry too much - different things 0  Trouble relaxing 0  Restless 0  Easily annoyed or  irritable 0  Afraid - awful might happen 0  Total GAD 7 Score 0    Social History   Tobacco Use   Smoking status: Every Day    Current packs/day: 1.00    Average packs/day: 1 pack/day for 30.0 years (30.0 ttl pk-yrs)    Types: Cigarettes   Smokeless tobacco: Current  Substance Use Topics   Alcohol use: Yes   Drug use: Never    Review of Systems Per HPI unless specifically indicated above     Objective:    BP 122/78   Pulse 77   Ht 5' 5 (1.651 m)   Wt 137 lb (62.1 kg)   SpO2 98%   BMI 22.80 kg/m   Wt Readings from Last 3 Encounters:  02/20/23 137 lb (62.1 kg)  06/15/18 129 lb (58.5 kg)    Physical Exam Vitals and nursing note reviewed.  Constitutional:      General: She is not in acute distress.    Appearance: Normal appearance. She is well-developed. She is not diaphoretic.     Comments: Well-appearing, uncomfortable, cooperative  HENT:     Head: Normocephalic and atraumatic.  Eyes:     General:        Right eye: No discharge.        Left eye: No discharge.     Conjunctiva/sclera: Conjunctivae normal.  Cardiovascular:     Rate and Rhythm: Normal rate.  Pulmonary:  Effort: Pulmonary effort is normal.  Skin:    General: Skin is warm and dry.     Findings: No erythema or rash.  Neurological:     Mental Status: She is alert and oriented to person, place, and time.  Psychiatric:        Mood and Affect: Mood normal.        Behavior: Behavior normal.        Thought Content: Thought content normal.     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 Displacement of the descending bilateral L5 nerve roots in the partially effaced lateral recesses. Abutment of the descending right S1 nerve root in the partially effaced lateral recess. No high-grade lumbar neural foraminal narrowing  Results for orders placed or performed during the  hospital encounter of 10/12/18  Basic metabolic panel   Collection Time: 10/11/18  8:10 PM  Result Value Ref Range   Sodium 138 135 - 145 mmol/L   Potassium 3.8 3.5 - 5.1 mmol/L   Chloride 105 98 - 111 mmol/L   CO2 23 22 - 32 mmol/L   Glucose, Bld 100 (H) 70 - 99 mg/dL   BUN 11 6 - 20 mg/dL   Creatinine, Ser 9.32 0.44 - 1.00 mg/dL   Calcium 9.9 8.9 - 89.6 mg/dL   GFR calc non Af Amer >60 >60 mL/min   GFR calc Af Amer >60 >60 mL/min   Anion gap 10 5 - 15  CBC   Collection Time: 10/11/18  8:10 PM  Result Value Ref Range   WBC 10.1 4.0 - 10.5 K/uL   RBC 4.47 3.87 - 5.11 MIL/uL   Hemoglobin 14.8 12.0 - 15.0 g/dL   HCT 56.7 63.9 - 53.9 %   MCV 96.6 80.0 - 100.0 fL   MCH 33.1 26.0 - 34.0 pg   MCHC 34.3 30.0 - 36.0 g/dL   RDW 86.9 88.4 - 84.4 %   Platelets 345 150 - 400 K/uL   nRBC 0.0 0.0 - 0.2 %  Troponin I (High Sensitivity)   Collection Time: 10/11/18  8:10 PM  Result Value Ref Range   Troponin I (High Sensitivity) <2 <18 ng/L  Troponin I (High Sensitivity)   Collection Time: 10/12/18  1:33 AM  Result Value Ref Range   Troponin I (High Sensitivity) <2 <18 ng/L  Fibrin derivatives D-Dimer   Collection Time: 10/12/18  2:51 AM  Result Value Ref Range   Fibrin derivatives D-dimer ([facility]) 206.63 0.00 - 499.00 ng/mL (FEU)      Assessment & Plan:   Problem List Items Addressed This Visit   None Visit Diagnoses       Chronic right-sided low back pain with right-sided sciatica    -  Primary   Relevant Medications   diphenhydramine-acetaminophen (TYLENOL PM) 25-500 MG TABS tablet   naproxen  (NAPROSYN ) 500 MG tablet   baclofen  (LIORESAL ) 10 MG tablet   gabapentin  (NEURONTIN ) 100 MG capsule     Degeneration of intervertebral disc of lumbar region with discogenic back pain and lower extremity pain       Relevant Medications   naproxen  (NAPROSYN ) 500 MG tablet   baclofen  (LIORESAL ) 10 MG tablet   gabapentin  (NEURONTIN ) 100 MG capsule        Chronic Back Pain Lumbar  Radiculopathy Followed by [facility] R sided sciatica symptoms, interfering with function Patient has had one spinal  injection and another is scheduled.  Revamp medication management today -Replace ibuprofen with prescription strength Naproxen  500mg  twice daily. -Add Baclofen  as needed for muscle spasms and pain. -Start Gabapentin  300mg  at bedtime for neuropathic pain, gradually increasing the dose as tolerated.  -Continue physical therapy exercises at home. -Check response to new medications and adjust as needed.  Gastroesophageal Reflux Disease Managed with over-the-counter Omeprazole 20mg  daily. -Continue current management.  General Health Maintenance / Followup Plans -Complete ADA paperwork for work accommodations. With Sit/Stand Desk and Lumbar support pillow -Plan for general checkup and blood work in three months.         No orders of the defined types were placed in this encounter.   Meds ordered this encounter  Medications   naproxen  (NAPROSYN ) 500 MG tablet    Sig: Take 1 tablet (500 mg total) by mouth 2 (two) times daily with a meal. For 2-4 weeks then as needed    Dispense:  60 tablet    Refill:  2   baclofen  (LIORESAL ) 10 MG tablet    Sig: Take 0.5-1 tablets (5-10 mg total) by mouth 3 (three) times daily as needed for muscle spasms.    Dispense:  30 each    Refill:  1   gabapentin  (NEURONTIN ) 100 MG capsule    Sig: Start 1 capsule daily, increase by 1 cap every 2-3 days as tolerated up to 3 times a day, or may take 3 at once in evening.    Dispense:  90 capsule    Refill:  1    Follow up plan: Return in about 3 months (around 05/21/2023) for 3 month fasting lab > 1 week later Annual Physical.  Future labs ordered for 05/2023   Marsa Officer, DO [facility] 02/20/2023, 10:12 AM

## 2023-02-20 NOTE — Patient Instructions (Addendum)
 Thank you for coming to the office today.  Recommend trial of Anti-inflammatory with Naproxen  (Naprosyn ) 500mg  tabs - take one with food and plenty of water TWICE daily every day (breakfast and dinner), for next 2 to 4 weeks, then you may take only as needed - DO NOT TAKE any ibuprofen, aleve , motrin while you are taking this medicine - It is safe to take Tylenol Ext Str 500mg  tabs - take 1 to 2 (max dose 1000mg ) every 6 hours as needed for breakthrough pain, max 24 hour daily dose is 6 to 8 tablets or 4000mg   Start taking Baclofen  (Lioresal ) 10mg  (muscle relaxant) - start with half (cut) to one whole pill at night as needed for next 1-3 nights (may make you drowsy, caution with driving) see how it affects you, then if tolerated increase to one pill 2 to 3 times a day or (every 8 hours as needed)  Start Gabapentin  100mg  capsules, take at night for 2-3 nights only, and then increase to 2 times a day for a few days, and then may increase to 3 times a day, it may make you drowsy, if helps significantly at night only, then you can increase instead to 3 capsules at night, instead of 3 times a day - In the future if needed, we can significantly increase the dose if tolerated well, some common doses are 300mg  three times a day up to 600mg  three times a day, usually it takes several weeks or months to get to higher doses   Please schedule a Follow-up Appointment to: Return in about 3 months (around 05/21/2023) for 3 month fasting lab > 1 week later Annual Physical.  If you have any other questions or concerns, please feel free to call the office or send a message through MyChart. You may also schedule an earlier appointment if necessary.  Additionally, you may be receiving a survey about your experience at our office within a few days to 1 week by e-mail or mail. We value your feedback.  Marsa Officer, DO [facility], [place]

## 2023-02-20 NOTE — Addendum Note (Signed)
 Addended by: Smitty Cords on: 02/20/2023 07:06 PM   Modules accepted: Level of Service

## 2023-03-03 DIAGNOSIS — M5416 Radiculopathy, lumbar region: Secondary | ICD-10-CM | POA: Diagnosis not present

## 2023-05-29 ENCOUNTER — Ambulatory Visit: Payer: Self-pay

## 2023-06-05 ENCOUNTER — Encounter: Payer: Self-pay | Admitting: Family Medicine
